# Patient Record
Sex: Male | Born: 1960 | Race: Black or African American | Hispanic: No | Marital: Single | Smoking: Former smoker
Health system: Southern US, Community
[De-identification: ages and names within clinical notes are randomized; demographics above are authoritative.]

## PROBLEM LIST (undated history)

## (undated) DIAGNOSIS — I10 Essential (primary) hypertension: Secondary | ICD-10-CM

## (undated) HISTORY — PX: BLADDER REPAIR: SHX76

## (undated) HISTORY — PX: CORONARY ANGIOPLASTY WITH STENT PLACEMENT: SHX49

## (undated) HISTORY — PX: OTHER SURGICAL HISTORY: SHX169

---

## 2020-07-22 ENCOUNTER — Emergency Department
Admission: EM | Admit: 2020-07-22 | Discharge: 2020-07-22 | Disposition: A | Discharge status: Home / Self Care | Payer: Medicaid Other | Attending: Emergency Medicine | Admitting: Emergency Medicine

## 2020-07-22 ENCOUNTER — Other Ambulatory Visit: Payer: Self-pay

## 2020-07-22 ENCOUNTER — Encounter: Payer: Self-pay | Admitting: *Deleted

## 2020-07-22 ENCOUNTER — Emergency Department: Payer: Medicaid Other

## 2020-07-22 DIAGNOSIS — R109 Unspecified abdominal pain: Secondary | ICD-10-CM | POA: Diagnosis not present

## 2020-07-22 DIAGNOSIS — I10 Essential (primary) hypertension: Secondary | ICD-10-CM | POA: Diagnosis not present

## 2020-07-22 DIAGNOSIS — Z955 Presence of coronary angioplasty implant and graft: Secondary | ICD-10-CM | POA: Diagnosis not present

## 2020-07-22 DIAGNOSIS — J690 Pneumonitis due to inhalation of food and vomit: Secondary | ICD-10-CM | POA: Diagnosis not present

## 2020-07-22 DIAGNOSIS — R112 Nausea with vomiting, unspecified: Secondary | ICD-10-CM | POA: Diagnosis present

## 2020-07-22 DIAGNOSIS — R52 Pain, unspecified: Secondary | ICD-10-CM

## 2020-07-22 DIAGNOSIS — M545 Low back pain, unspecified: Secondary | ICD-10-CM | POA: Diagnosis not present

## 2020-07-22 HISTORY — DX: Essential (primary) hypertension: I10

## 2020-07-22 LAB — CBC
HCT: 41.4 % (ref 39.0–52.0)
Hemoglobin: 14.2 g/dL (ref 13.0–17.0)
MCH: 30.7 pg (ref 26.0–34.0)
MCHC: 34.3 g/dL (ref 30.0–36.0)
MCV: 89.6 fL (ref 80.0–100.0)
Platelets: 193 10*3/uL (ref 150–400)
RBC: 4.62 MIL/uL (ref 4.22–5.81)
RDW: 12.6 % (ref 11.5–15.5)
WBC: 15.7 10*3/uL — ABNORMAL HIGH (ref 4.0–10.5)
nRBC: 0 % (ref 0.0–0.2)

## 2020-07-22 LAB — BASIC METABOLIC PANEL
Anion gap: 8 (ref 5–15)
BUN: 13 mg/dL (ref 6–20)
CO2: 24 mmol/L (ref 22–32)
Calcium: 8.6 mg/dL — ABNORMAL LOW (ref 8.9–10.3)
Chloride: 103 mmol/L (ref 98–111)
Creatinine, Ser: 0.94 mg/dL (ref 0.61–1.24)
GFR, Estimated: >60 mL/min (ref >60)
Glucose, Bld: 151 mg/dL — ABNORMAL HIGH (ref 70–99)
Potassium: 4.1 mmol/L (ref 3.5–5.1)
Sodium: 135 mmol/L (ref 135–145)

## 2020-07-22 LAB — HEPATIC FUNCTION PANEL
ALT: 28 U/L (ref 0–44)
AST: 28 U/L (ref 15–41)
Albumin: 4.2 g/dL (ref 3.5–5.0)
Alkaline Phosphatase: 66 U/L (ref 38–126)
Bilirubin, Direct: 0.4 mg/dL — ABNORMAL HIGH (ref 0.0–0.2)
Indirect Bilirubin: 3.8 mg/dL — ABNORMAL HIGH (ref 0.3–0.9)
Total Bilirubin: 4.2 mg/dL — ABNORMAL HIGH (ref 0.3–1.2)
Total Protein: 7.2 g/dL (ref 6.5–8.1)

## 2020-07-22 LAB — LACTIC ACID, PLASMA
Lactic Acid, Venous: 2.1 mmol/L (ref 0.5–1.9)
Lactic Acid, Venous: 1 mmol/L (ref 0.5–1.9)

## 2020-07-22 LAB — TROPONIN I (HIGH SENSITIVITY)
Troponin I (High Sensitivity): 6 ng/L (ref <18)
Troponin I (High Sensitivity): 7 ng/L (ref <18)

## 2020-07-22 LAB — LIPASE, BLOOD: Lipase: 22 U/L (ref 11–51)

## 2020-07-22 MED ORDER — SODIUM CHLORIDE 0.9 % IV SOLN
2.0000 g | Freq: Once | INTRAVENOUS | Status: AC
  Administered 2020-07-22: 2 g via INTRAVENOUS
  Filled 2020-07-22: qty 20

## 2020-07-22 MED ORDER — AMOXICILLIN-POT CLAVULANATE 875-125 MG PO TABS: 1.0000 | ORAL_TABLET | Freq: Two times a day (BID) | ORAL | 0 refills | Status: AC

## 2020-07-22 MED ORDER — PREDNISONE 20 MG PO TABS: 40.0000 mg | ORAL_TABLET | Freq: Every day | ORAL | 0 refills | Status: AC

## 2020-07-22 MED ORDER — DEXAMETHASONE SODIUM PHOSPHATE 10 MG/ML IJ SOLN
10.0000 mg | Freq: Once | INTRAMUSCULAR | Status: AC
  Administered 2020-07-22: 10 mg via INTRAVENOUS
  Filled 2020-07-22: qty 1

## 2020-07-22 MED ORDER — SODIUM CHLORIDE 0.9 % IV BOLUS
1000.0000 mL | Freq: Once | INTRAVENOUS | Status: AC
  Administered 2020-07-22: 1000 mL via INTRAVENOUS

## 2020-07-22 MED ORDER — ONDANSETRON HCL 4 MG/2ML IJ SOLN
4.0000 mg | Freq: Once | INTRAMUSCULAR | Status: AC
  Administered 2020-07-22: 4 mg via INTRAVENOUS
  Filled 2020-07-22: qty 2

## 2020-07-22 MED ORDER — ONDANSETRON 4 MG PO TBDP: 4.0000 mg | ORAL_TABLET | Freq: Three times a day (TID) | ORAL | 0 refills | Status: AC | PRN

## 2020-07-22 MED ORDER — OXYCODONE-ACETAMINOPHEN 5-325 MG PO TABS
2.0000 | ORAL_TABLET | Freq: Once | ORAL | Status: AC
  Administered 2020-07-22: 2 via ORAL
  Filled 2020-07-22: qty 2

## 2020-07-22 NOTE — ED Triage Notes (Signed)
Pt had one episode bile emesis after triage.

## 2020-07-22 NOTE — ED Provider Notes (Signed)
[facility] Emergency Department Provider Note  ____________________________________________   Event Date/Time   First MD Initiated Contact with Patient 07/22/20 1927     (approximate)  I have reviewed the triage vital signs and the nursing notes.   HISTORY  Chief Complaint Emesis and Chest Pain    HPI Ethan Arellano is a 60 y.o. male here with multiple complaints.  Patient's primary complaint is diffuse, mild abdominal pain with associated nausea and vomiting.  He went to a cookout on July 4, then woke up with diffuse abdominal pain.  He said ongoing, aching, gnawing, cramp-like abdominal pain since then.  He is also noticed increasing lower back pain, with radiation of numbness and a cramping sensation down the back of his right leg.  Has history of lower back issues, and this pain was there prior to the vomiting, but has now been made worse.  Denies any falls.  No lower extremity weakness or numbness.  No loss of bowel or bladder function.  No fevers or chills.      Past Medical History:  Diagnosis Date   Hypertension     There are no problems to display for this patient.   Past Surgical History:  Procedure Laterality Date   BLADDER REPAIR     CORONARY ANGIOPLASTY WITH STENT PLACEMENT      Prior to Admission medications   Medication Sig Start Date End Date Taking? Authorizing Provider  amoxicillin-clavulanate (AUGMENTIN) 875-125 MG tablet Take 1 tablet by mouth 2 (two) times daily for 7 days. 07/22/20 07/29/20 Yes Shaune PollackIsaacs, Laira Penninger, MD  ondansetron (ZOFRAN ODT) 4 MG disintegrating tablet Take 1 tablet (4 mg total) by mouth every 8 (eight) hours as needed for nausea or vomiting. 07/22/20  Yes Shaune PollackIsaacs, Florice Hindle, MD  predniSONE (DELTASONE) 20 MG tablet Take 2 tablets (40 mg total) by mouth daily for 5 days. 07/22/20 07/27/20 Yes Shaune PollackIsaacs, Gedalia Mcmillon, MD    Allergies Patient has no known allergies.  No family history on file.  Social History    Review of  Systems  Review of Systems  Constitutional:  Positive for fatigue. Negative for chills and fever.  HENT:  Negative for sore throat.   Respiratory:  Negative for shortness of breath.   Cardiovascular:  Negative for chest pain.  Gastrointestinal:  Positive for abdominal pain, nausea and vomiting.  Genitourinary:  Negative for flank pain.  Musculoskeletal:  Positive for back pain and myalgias. Negative for neck pain.  Skin:  Negative for rash and wound.  Allergic/Immunologic: Negative for immunocompromised state.  Neurological:  Negative for weakness and numbness.  Hematological:  Does not bruise/bleed easily.  All other systems reviewed and are negative.   ____________________________________________  PHYSICAL EXAM:      VITAL SIGNS: ED Triage Vitals  Enc Vitals Group     BP 07/22/20 1907 (!) 82/59     Pulse Rate 07/22/20 1907 (!) 42     Resp 07/22/20 1907 20     Temp 07/22/20 1907 98.5 F (36.9 C)     Temp Source 07/22/20 1907 Oral     SpO2 07/22/20 1907 99 %     Weight 07/22/20 1914 230 lb (104.3 kg)     Height 07/22/20 1914 5\' 8"  (1.727 m)     Head Circumference --      Peak Flow --      Pain Score 07/22/20 1914 10     Pain Loc --      Pain Edu? --  Excl. in GC? --      Physical Exam Vitals and nursing note reviewed.  Constitutional:      General: He is not in acute distress.    Appearance: He is well-developed.  HENT:     Head: Normocephalic and atraumatic.  Eyes:     Conjunctiva/sclera: Conjunctivae normal.  Cardiovascular:     Rate and Rhythm: Normal rate and regular rhythm.     Heart sounds: Normal heart sounds. No murmur heard.   No friction rub.  Pulmonary:     Effort: Pulmonary effort is normal. No respiratory distress.     Breath sounds: Normal breath sounds. No wheezing or rales.  Abdominal:     General: There is no distension.     Palpations: Abdomen is soft.     Tenderness: There is abdominal tenderness (Mild, diffuse, without rebound or  guarding).  Musculoskeletal:     Cervical back: Neck supple.     Comments: Moderate paraspinal lower lumbar tenderness.  Tenderness over the right sciatic area, with radiation down the posterior right leg to the knee.  Strength out of 5 bilateral lower extremities.  Normal sensation to light touch.  Skin:    General: Skin is warm.     Capillary Refill: Capillary refill takes less than 2 seconds.  Neurological:     Mental Status: He is alert and oriented to person, place, and time.     Motor: No abnormal muscle tone.      ____________________________________________   LABS (all labs ordered are listed, but only abnormal results are displayed)  Labs Reviewed  BASIC METABOLIC PANEL - Abnormal; Notable for the following components:      Result Value   Glucose, Bld 151 (*)    Calcium 8.6 (*)    All other components within normal limits  CBC - Abnormal; Notable for the following components:   WBC 15.7 (*)    All other components within normal limits  HEPATIC FUNCTION PANEL - Abnormal; Notable for the following components:   Total Bilirubin 4.2 (*)    Bilirubin, Direct 0.4 (*)    Indirect Bilirubin 3.8 (*)    All other components within normal limits  LACTIC ACID, PLASMA - Abnormal; Notable for the following components:   Lactic Acid, Venous 2.1 (*)    All other components within normal limits  LIPASE, BLOOD  LACTIC ACID, PLASMA  TROPONIN I (HIGH SENSITIVITY)  TROPONIN I (HIGH SENSITIVITY)    ____________________________________________  EKG: Sinus bradycardia, ventricular rate 59.  PR 244, QRS 80, QTc 4 5.  No acute ST elevations or depressions.  No EKG evidence of acute ischemia or infarct. ________________________________________  RADIOLOGY All imaging, including plain films, CT scans, and ultrasounds, independently reviewed by me, and interpretations confirmed via formal radiology reads.  ED MD interpretation:   Chest x-ray: Patchy airspace infiltrate in the left lower  lung CT abdomen/pelvis: Left lower lobe pneumonia, otherwise no acute intra-abdominal pathology CT lumbar spine: Mild degenerative changes with multilevel disc bulges  Official radiology report(s): CT ABDOMEN PELVIS WO CONTRAST  Result Date: 07/22/2020 CLINICAL DATA:  Vomiting, abdominal pain, and right leg pain. EXAM: CT ABDOMEN AND PELVIS WITHOUT CONTRAST TECHNIQUE: Multidetector CT imaging of the abdomen and pelvis was performed following the standard protocol without IV contrast. COMPARISON:  None. FINDINGS: Lower chest: Focal airspace infiltration in the left lung base, likely pneumonia. Small esophageal hiatal hernia behind the heart. Hepatobiliary: No focal liver abnormality is seen. No gallstones, gallbladder wall thickening, or biliary dilatation.  Pancreas: Unremarkable. No pancreatic ductal dilatation or surrounding inflammatory changes. Spleen: Normal in size without focal abnormality. Adrenals/Urinary Tract: Adrenal glands are unremarkable. Kidneys are normal, without renal calculi, focal lesion, or hydronephrosis. Bladder is unremarkable. Stomach/Bowel: Stomach, small bowel, and colon are not abnormally distended. No wall thickening or inflammatory changes are appreciated. Appendix is normal. Vascular/Lymphatic: Aortic atherosclerosis. No enlarged abdominal or pelvic lymph nodes. Reproductive: Prostate is unremarkable. Other: No abdominal wall hernia or abnormality. No abdominopelvic ascites. Musculoskeletal: No acute or significant osseous findings. Postoperative changes in the left proximal femur. IMPRESSION: 1. Focal airspace infiltration in the left lung base, likely pneumonia. 2. Small esophageal hiatal hernia. 3. No acute process demonstrated in the abdomen or pelvis. 4. Aortic atherosclerosis. Electronically Signed   By: Burman Nieves M.D.   On: 07/22/2020 20:26   DG Chest 2 View  Result Date: 07/22/2020 CLINICAL DATA:  Shortness of breath, vomiting, and right-sided chest pain.  Diarrhea. EXAM: CHEST - 2 VIEW COMPARISON:  None. FINDINGS: Heart size and pulmonary vascularity are normal. Suggestion of patchy airspace infiltration in the left base, possibly early pneumonia. No pleural effusions. No pneumothorax. Mediastinal contours appear intact. IMPRESSION: Suggestion of patchy airspace infiltrates in the left lower lung. Electronically Signed   By: Burman Nieves M.D.   On: 07/22/2020 19:41   CT L-SPINE NO CHARGE  Result Date: 07/22/2020 CLINICAL DATA:  Vomiting, abdominal pain, and right leg pain. EXAM: CT LUMBAR SPINE WITHOUT CONTRAST TECHNIQUE: Multidetector CT imaging of the lumbar spine was performed without intravenous contrast administration. Multiplanar CT image reconstructions were also generated. COMPARISON:  None. FINDINGS: Segmentation: 5 lumbar type vertebrae. Alignment: Normal. Vertebrae: Schmorl's nodes at the inferior inflate of L3 and L5. No vertebral compression deformities. No focal bone lesion or bone destruction. Paraspinal and other soft tissues: No abnormal paraspinal soft tissue mass or infiltration. Calcification of the aorta without aneurysm. Disc levels: Mild degenerative changes with small endplate osteophyte formation. Disc space heights are mostly normal. Bulging disc annuli are suggested at L3-4, L4-5, and L5-S1 causing some apparent effacement of the anterior thecal sac. Probable effacement of the right lateral recess at L5-S1. IMPRESSION: 1. No acute displaced fractures identified. 2. Mild degenerative changes with multilevel disc bulges in the lower lumbar spine. Electronically Signed   By: Burman Nieves M.D.   On: 07/22/2020 20:28    ____________________________________________  PROCEDURES   Procedure(s) performed (including Critical Care):  Procedures  ____________________________________________  INITIAL IMPRESSION / MDM / ASSESSMENT AND PLAN / ED COURSE  As part of my medical decision making, I reviewed the following data within  the electronic MEDICAL RECORD NUMBER Nursing notes reviewed and incorporated, Old chart reviewed, Notes from prior ED visits, and [place] Controlled Substance Database       *Green Quincy was evaluated in Emergency Department on 07/22/2020 for the symptoms described in the history of present illness. He was evaluated in the context of the global COVID-19 pandemic, which necessitated consideration that the patient might be at risk for infection with the SARS-CoV-2 virus that causes COVID-19. Institutional protocols and algorithms that pertain to the evaluation of patients at risk for COVID-19 are in a state of rapid change based on information released by regulatory bodies including the CDC and federal and state organizations. These policies and algorithms were followed during the patient's care in the ED.  Some ED evaluations and interventions may be delayed as a result of limited staffing during the pandemic.*     Medical Decision Making: 60 year old male  here with multiple complaints, primarily nausea, vomiting, and some mild cough or shortness of breath.  Regarding his nausea and vomiting, unclear primary etiology.  His abdomen is soft with no focal tenderness.  CT scan obtained, shows left lower lobe pneumonia, but otherwise no intra-abdominal pathology.  Lab work shows mild leukocytosis and dehydration on arrival, with very minimal lactic acid elevation which cleared with fluids.  LFTs normal.  Patient has slight bilirubin elevation but no evidence of abnormal gallbladder or acute inflammation in the right upper quadrant on CT.  Otherwise, he is not hypoxic and is otherwise very well-appearing.  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  Symptoms are consistent with likely sciatica versus inflamed lumbar radiculopathy.  CT of the lumbar spine shows degenerative changes multilevel disc bulges consistent with this.  No evidence of cauda  equina.  No risk factors for epidural abscess or psoas abscess.  Patient feels markedly improved after fluids and present to control.  Lactic acid is completely cleared.  Will treat for community-acquired versus aspiration pneumonia, treat symptomatically, discharged home.  ____________________________________________  FINAL CLINICAL IMPRESSION(S) / ED DIAGNOSES  Final diagnoses:  Aspiration pneumonia of left lower lobe, unspecified aspiration pneumonia type [facility])     MEDICATIONS GIVEN DURING THIS VISIT:  Medications  sodium chloride 0.9 % bolus 1,000 mL (0 mLs Intravenous Stopped 07/22/20 2254)  ondansetron (ZOFRAN) injection 4 mg (4 mg Intravenous Given 07/22/20 2140)  cefTRIAXone (ROCEPHIN) 2 g in sodium chloride 0.9 % 100 mL IVPB (0 g Intravenous Stopped 07/22/20 2221)  dexamethasone (DECADRON) injection 10 mg (10 mg Intravenous Given 07/22/20 2140)  oxyCODONE-acetaminophen (PERCOCET/ROXICET) 5-325 MG per tablet 2 tablet (2 tablets Oral Given 07/22/20 2140)     ED Discharge Orders          Ordered    amoxicillin-clavulanate (AUGMENTIN) 875-125 MG tablet  2 times daily        07/22/20 2252    ondansetron (ZOFRAN ODT) 4 MG disintegrating tablet  Every 8 hours PRN        07/22/20 2252    predniSONE (DELTASONE) 20 MG tablet  Daily        07/22/20 2252             Note:  This document was prepared using Dragon voice recognition software and may include unintentional dictation errors.   Shaune Pollack, MD 07/22/20 [phone]

## 2020-07-22 NOTE — ED Triage Notes (Signed)
Pt c/o right sided chest pain, shortness of breath, and vomiting. Has also had some diarrhea. Diaphoretic.

## 2020-07-22 NOTE — ED Notes (Signed)
Pt reports vomiting x 1 with abd pain and right leg pain.  No diarrhea.  Pt diaphoretic in triage and brought to room 9 stat.  Iv started and labs sent.  Pt alert, speech clear.

## 2020-08-04 ENCOUNTER — Ambulatory Visit: Payer: Medicaid Other | Admitting: Physical Therapy

## 2020-08-07 ENCOUNTER — Ambulatory Visit: Payer: Medicaid Other | Attending: Physical Medicine & Rehabilitation | Admitting: Physical Therapy

## 2020-08-07 ENCOUNTER — Encounter: Payer: Self-pay | Admitting: Physical Therapy

## 2020-08-07 DIAGNOSIS — M6281 Muscle weakness (generalized): Secondary | ICD-10-CM

## 2020-08-07 DIAGNOSIS — R262 Difficulty in walking, not elsewhere classified: Secondary | ICD-10-CM

## 2020-08-07 DIAGNOSIS — M5441 Lumbago with sciatica, right side: Secondary | ICD-10-CM

## 2020-08-07 NOTE — Therapy (Deleted)
[facility] [address], [place], [address] Phone: [phone]   Fax:  [phone]  Physical Therapy Evaluation  Patient Details  Name: Ethan Arellano MRN: [id number] Date of Birth: 1960/09/23 No data recorded  Encounter Date: 08/07/2020    Past Medical History:  Diagnosis Date   Hypertension     Past Surgical History:  Procedure Laterality Date   BLADDER REPAIR     CORONARY ANGIOPLASTY WITH STENT PLACEMENT      There were no vitals filed for this visit.            SUBJECTIVE: HISTORY:  Patient is a 60 y.o. male who presents to outpatient physical therapy with a referral for medical diagnosis right lumbar radiculitis. This patient's chief complaints consist of ***, leading to the following functional deficits: ***. Relevant past medical history and comorbidities include HTN, coronary angioplasty with stent placement, hx bladder repair.  Patient denies hx of cancer, stroke, seizures, lung problem, major cardiac events, diabetes, unexplained weight loss, changes in bowel or bladder problems, new onset stumbling or dropping things apart from described below.   Patient states condition started when ***.   Functional Limitations: ***         Objective measurements completed on examination: See above findings.                             Patient will benefit from skilled therapeutic intervention in order to improve the following deficits and impairments:     Visit Diagnosis: No diagnosis found.     Problem List There are no problems to display for this patient.   Cira Rue 08/07/2020, 10:55 AM  [facility] [address], [place], [address] Phone: [phone]   Fax:  [phone]  Name: Ethan Arellano MRN: [id number] Date of Birth: 09-11-1960
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
## 2020-08-07 NOTE — Patient Instructions (Signed)
  HOME EXERCISE PROGRAM Access Code: [email or web address] URL: [email or web address] Date: 08/07/2020 Prepared by: Norton Blizzard  Exercises Seated Slump Nerve Glide - 3 x daily - 15 reps

## 2020-08-12 ENCOUNTER — Ambulatory Visit: Payer: Medicaid Other | Admitting: Physical Therapy

## 2020-08-12 ENCOUNTER — Encounter: Payer: Self-pay | Admitting: Physical Therapy

## 2020-08-12 DIAGNOSIS — M5441 Lumbago with sciatica, right side: Secondary | ICD-10-CM | POA: Diagnosis not present

## 2020-08-12 DIAGNOSIS — R262 Difficulty in walking, not elsewhere classified: Secondary | ICD-10-CM

## 2020-08-12 DIAGNOSIS — M6281 Muscle weakness (generalized): Secondary | ICD-10-CM

## 2020-08-12 NOTE — Therapy (Signed)
[facility] [address] [place], [address] Phone: [phone]   Fax:  [phone]  Physical Therapy Treatment  Patient Details  Name: Ethan Arellano MRN: [id number] Date of Birth: 03/27/1960 Referring Provider (PT): Carnella Guadalajara, DO   Encounter Date: 08/12/2020   PT End of Session - 08/12/20 1151     Visit Number 2    Number of Visits 24    Date for PT Re-Evaluation 10/30/20    Authorization Type [place] [employer]ICA[employer] OF  reporting period from 08/07/2020    Authorization Time Period Max 27 combined PT/OT/SLP per year  Berkley Harvey is not required for first 12 visits, then auth requried    Authorization - Visit Number 2    Authorization - Number of Visits 12    Progress Note Due on Visit 10    PT Start Time 1130    PT Stop Time 1210    PT Time Calculation (min) 40 min    Activity Tolerance Patient tolerated treatment well    Behavior During Therapy --   required frequent redirection            Past Medical History:  Diagnosis Date   Hypertension     Past Surgical History:  Procedure Laterality Date   BLADDER REPAIR     CORONARY ANGIOPLASTY WITH STENT PLACEMENT      There were no vitals filed for this visit.   Subjective Assessment - 08/12/20 1132     Subjective Patient reports his right leg is still feeling numb. States it is seems to be improving over time and feels that PT from last session helped some. He keeps getting a cramp in his right calf and is worried that he has a blood clot there. Does not provide a numeric pain rating.    Pertinent History Patient is a 60 y.o. male who presents to outpatient physical therapy with a referral for medical diagnosis right lumbar radiculitis with plan to start him in a mechanical diagnosis and treatment approach. This patient's chief complaints consist of constant bilateral low back pain with right radicular symptoms to the toe since 07/21/2020  (with extended history of episodic localized left back pain) leading to the following functional deficits: difficulty with all activiites due to constant pain including, bed mobility, laying down, sleeping, sitting, walking, dressing, stairs, daily activities.   Relevant past medical history and comorbidities include former smoker, HTN, CAD, DM, PAD, pneumonia,  FRACTURE SURGERY left  femur, left lower leg, MANDIBLE SURGERY 1995, hx bladder repair, hx coronary angioplasty with stent placement.  Patient denies hx of cancer, stroke, lung problem, unexplained weight loss, changes in bowel or bladder problems, spine surgery.    Limitations Sitting;Lifting;Standing;Walking;House hold activities    Diagnostic tests CT report 07/22/2020: "FINDINGS:  Segmentation: 5 lumbar type vertebrae.     Alignment: Normal.     Vertebrae: Schmorl's nodes at the inferior inflate of L3 and L5. No  vertebral compression deformities. No focal bone lesion or bone  destruction.     Paraspinal and other soft tissues: No abnormal paraspinal soft  tissue mass or infiltration. Calcification of the aorta without  aneurysm.     Disc levels: Mild degenerative changes with small endplate  osteophyte formation. Disc space heights are mostly normal. Bulging  disc annuli are suggested at L3-4, L4-5, and L5-S1 causing some  apparent effacement of the anterior thecal sac. Probable effacement  of the right lateral recess at L5-S1.  IMPRESSION:  1. No acute displaced fractures identified.  2. Mild degenerative changes with multilevel disc bulges in the  lower lumbar spine."    Currently in Pain? Yes    Pain Onset More than a month ago             TREATMENT:    Therapeutic exercise: to centralize symptoms and improve ROM, strength, muscular endurance, and activity tolerance required for successful completion of functional activities.  - seated sciatic nerve glide in slump position, R LE, 1x15 (Manual therapy - see below) - prone press up  3x10 (pain centralizing from calf to upper thigh, with lock and sag last rep) - hooklying low trunk rotation, 1x20 each direction - quadruped bird dog 1x4 each side plus practice (difficulty due to R calf cramp) - supine R hamstring stretch, 3x30 seconds - hooklying bridge with hip abduction against blue band around knees, 5 second holds, 2x10 - Education on HEP including handout   Manual therapy: to reduce pain and tissue tension, improve range of motion, neuromodulation, in order to promote improved ability to complete functional activities. - prone CPA grade III-IV to lower thoracic and lumbar segments - prone STM to right glute, posterior thigh, and calf muscles (decreased pain in calf).     HOME EXERCISE PROGRAM Access Code: [email or web address] URL: [email or web address] Date: 08/12/2020 Prepared by: Norton Blizzard  Exercises Seated Slump Nerve Glide - 3 x daily - 15 reps Prone Press Up - 3 x daily - 10-15 reps - 1 second hold Hamstring stretch (with strap) - 1-2 x daily - 3 reps - 30 seconds hold Bridge with Hip Abduction and Resistance - Ground Touches - 1-2 x daily - 2 sets - 10 reps - 5 seconds hold   PT Education - 08/12/20 1215     Education Details Exercise purpose/form. Self management techniques    Person(s) Educated Patient    Methods Explanation;Demonstration;Tactile cues;Verbal cues;Handout    Comprehension Verbalized understanding;Returned demonstration;Verbal cues required;Tactile cues required;Need further instruction              PT Short Term Goals - 08/07/20 1640       PT SHORT TERM GOAL #1   Title Be independent with initial home exercise program for self-management of symptoms.    Baseline Initial HEP provided at IE (08/07/2020);    Time 2    Period Weeks    Status New    Target Date 08/21/20               PT Long Term Goals - 08/07/20 1641       PT LONG TERM GOAL #1   Title Be independent with a long-term home exercise program for  self-management of symptoms.    Baseline Initial HEP provided at IE (08/07/2020);    Time 12    Period Weeks    Status New   TARGET DATE FOR ALL LONG TERM GOALS: 10/30/2020     PT LONG TERM GOAL #2   Title Demonstrate improved FOTO score to equal or greater than 47 by visit #11 to demonstrate improvement in overall condition and self-reported functional ability.    Baseline 16 (08/07/2020);    Time 12    Period Weeks    Status New      PT LONG TERM GOAL #3   Title Have full lumbar AROM with no compensations or increase in pain in all planes except intermittent end range discomfort to allow patient to complete valued activities with less difficulty.  Baseline painful and limited - see objective exam (08/07/2020);    Time 12    Period Weeks    Status New      PT LONG TERM GOAL #4   Title Improve B LE strength to 4+/5 with no increase in pain to improve patient's ability to complete  valued functional tasks such as walking, using stairs, transfers with less difficulty.    Baseline painful and weak - see objective exam (08/07/2020);    Time 12    Period Weeks    Status New      PT LONG TERM GOAL #5   Title Complete community, work and/or recreational activities without limitation due to current condition    Baseline ifficulty with all activiites due to constant pain including, bed mobility, laying down, sleeping, sitting, walking, dressing, stairs, daily activities (08/07/2020);    Time 12    Period Weeks    Status New      Additional Long Term Goals   Additional Long Term Goals Yes      PT LONG TERM GOAL #6   Title Reduce pain with functional activities to equal or less than 3/10 to allow patient to complete usual activities including ADLs, IADLs, sleeping, and social engagement with less difficulty.    Baseline reported as 10/10 constantly (08/07/2020);    Time 12    Period Weeks    Status New                   Plan - 08/12/20 1218     Clinical Impression Statement  Patient tolerated treatment well with no increase in pain overall. Does appear to be improving with subjective report of improvement and observed to be moving more freely with sit <> stand and mobility today in clinic. Did report some centralizing from right calf to proximal thigh during prone press up with no lasting improvement. No abnormal redness, heat, swelling or exquisite tenderness noted at right calf and patient educated that he is most likely experiencing cramps there due to spinal nerve irritation and there are no signs of blood clot, which relieved some of his anxiety. Did have some cramping there with visible muscle contraction when performing bird dog exercise so discontinued for now. Updated HEP as appropriate. Patient would benefit from continued management of limiting condition by skilled physical therapist to address remaining impairments and functional limitations to work towards stated goals and return to PLOF or maximal functional independence.    Personal Factors and Comorbidities Age;Behavior Pattern;Comorbidity 3+;Social Background;Past/Current Experience;Fitness;Time since onset of injury/illness/exacerbation    Comorbidities Relevant past medical history and comorbidities include former smoker, HTN, CAD, DM, PAD, pneumonia,  FRACT    Examination-Activity Limitations Bathing;Squat;Lift;Stairs;Bed Mobility;Bend;Locomotion Level;Stand;Carry;Transfers;Sit;Dressing;Sleep    Examination-Participation Restrictions Community Activity;Yard Work;Interpersonal Relationship    Stability/Clinical Decision Making Evolving/Moderate complexity    Rehab Potential Good    PT Frequency 2x / week    PT Duration 12 weeks    PT Treatment/Interventions ADLs/Self Care Home Management;Aquatic Therapy;Cryotherapy;Moist Heat;Traction;Electrical Stimulation;Gait training;Stair training;Functional mobility training;Therapeutic activities;Therapeutic exercise;Balance training;Neuromuscular re-education;Manual  techniques;Dry needling;Passive range of motion;Patient/family education;Spinal Manipulations;Joint Manipulations    PT Next Visit Plan continue to assess for directional preference as appropriate, strength/mobility as tolerated, neurodynamics, update HEP as appropriate    PT Home Exercise Plan Medbridge Access Code: [email or web address]    Consulted and Agree with Plan of Care Patient             Patient will benefit from skilled therapeutic intervention in order to improve  the following deficits and impairments:  Abnormal gait, Impaired sensation, Improper body mechanics, Pain, Decreased mobility, Increased muscle spasms, Postural dysfunction, Decreased activity tolerance, Decreased endurance, Decreased range of motion, Decreased strength, Hypomobility, Impaired perceived functional ability, Difficulty walking, Decreased balance  Visit Diagnosis: Bilateral low back pain with right-sided sciatica, unspecified chronicity  Difficulty in walking, not elsewhere classified  Muscle weakness (generalized)     Problem List There are no problems to display for this patient.   Luretha Murphy. Ilsa Iha, PT, DPT 08/12/20, 12:21 PM   [facility] SPORTS [facility]ICINE [address], [place], [address] Phone: [phone]   Fax:  [phone]  Name: Ethan Arellano MRN: [id number] Date of Birth: Mar 12, 1960

## 2020-08-14 ENCOUNTER — Encounter: Payer: Self-pay | Admitting: Physical Therapy

## 2020-08-14 ENCOUNTER — Ambulatory Visit: Payer: Medicaid Other | Admitting: Physical Therapy

## 2020-08-14 DIAGNOSIS — R262 Difficulty in walking, not elsewhere classified: Secondary | ICD-10-CM

## 2020-08-14 DIAGNOSIS — M5441 Lumbago with sciatica, right side: Secondary | ICD-10-CM | POA: Diagnosis not present

## 2020-08-14 DIAGNOSIS — M6281 Muscle weakness (generalized): Secondary | ICD-10-CM

## 2020-08-14 NOTE — Therapy (Addendum)
[facility] [address] [place], [address] Phone: [phone]   Fax:  [phone]  Physical Therapy Treatment  Patient Details  Name: Ethan Arellano MRN: [id number] Date of Birth: [birth year] Referring Provider (PT): Ethan Guadalajara, DO   Encounter Date: 08/14/2020   PT End of Session - 08/14/20 1150     Visit Number 3    Number of Visits 24    Date for PT Re-Evaluation 10/30/20    Authorization Type [place] MEDICAID [employer] CARITAS OF [place] reporting period from 08/07/2020    Authorization Time Period Max 27 combined PT/OT/SLP per year  Berkley Harvey is not required for first 12 visits, then [place] requried    Authorization - Visit Number 2    Authorization - Number of Visits 12    Progress Note Due on Visit 10    PT Start Time 1035    PT Stop Time 1135    PT Time Calculation (min) 60 min    Activity Tolerance Patient tolerated treatment well    Behavior During Therapy --             Past Medical History:  Diagnosis Date   Hypertension     Past Surgical History:  Procedure Laterality Date   BLADDER REPAIR     CORONARY ANGIOPLASTY WITH STENT PLACEMENT      There were no vitals filed for this visit.   08/14/20 1143  Symptoms/Limitations  Subjective Patient reports "doing okay." He states that he tried to do all the exercises we gave  last time. Patient believes that he pulled a muscle in his R inner thigh. He asked his primary care doctor to take imaging to determine if he strained it. He states that he is constantly in pain. Current pain reported 8/10. Patient states that he is getting more used to the numbness in his leg.  Pertinent History Patient is a 60 y.o. male who presents to outpatient physical therapy with a referral for medical diagnosis right lumbar radiculitis with plan to start him in a mechanical diagnosis and treatment approach. This patient's chief complaints consist of constant bilateral low  back pain with right radicular symptoms to the toe since 07/21/2020 (with extended history of episodic localized left back pain) leading to the following functional deficits: difficulty with all activiites due to constant pain including, bed mobility, laying down, sleeping, sitting, walking, dressing, stairs, daily activities.   Relevant past medical history and comorbidities include former smoker, HTN, CAD, DM, PAD, pneumonia,  FRACTURE SURGERY left  femur, left lower leg, MANDIBLE SURGERY 1995, hx bladder repair, hx coronary angioplasty with stent placement.  Patient denies hx of cancer, stroke, lung problem, unexplained weight loss, changes in bowel or bladder problems, spine surgery.  Limitations Sitting;Lifting;Standing;Walking;House hold activities  Diagnostic tests CT report 07/22/2020: "FINDINGS:  Segmentation: 5 lumbar type vertebrae.     Alignment: Normal.     Vertebrae: Schmorl's nodes at the inferior inflate of L3 and L5. No  vertebral compression deformities. No focal bone lesion or bone  destruction.     Paraspinal and other soft tissues: No abnormal paraspinal soft  tissue mass or infiltration. Calcification of the aorta without  aneurysm.     Disc levels: Mild degenerative changes with small endplate  osteophyte formation. Disc space heights are mostly normal. Bulging  disc annuli are suggested at L3-4, L4-5, and L5-S1 causing some  apparent effacement of the anterior thecal sac. Probable effacement  of the right lateral  recess at L5-S1.     IMPRESSION:  1. No acute displaced fractures identified.  2. Mild degenerative changes with multilevel disc bulges in the  lower lumbar spine."  Pain Assessment  Pain Score 8  Pain Location Back  Pain Orientation Right;Left  Pain Onset More than a month ago  Pain Frequency Constant    TREATMENT: Manual therapy: to reduce pain and tissue tension, improve range of motion, neuromodulation, in order to promote improved ability to complete functional  activities. - prone CPA grade III-IV to lower thoracic and lumbar segments - prone STM to right glute, posterior thigh, and calf muscles    Therapeutic exercise: to centralize symptoms and improve ROM, strength, muscular endurance, and activity tolerance required for successful completion of functional activities.  - prone press up 3x10 - manual therapy (see above). - prone press up 2x10 - quadruped bird dog 2x8 each side plus practice - hooklying bridge with hip abduction against blue band around knees, 5 second holds, 2x10 - seated sciatic nerve glide in slump position, R LE, 1x15  Pt required multimodal cuing for proper technique and to facilitate improved neuromuscular control, strength, range of motion, and functional ability resulting in improved performance and form.   HOME EXERCISE PROGRAM Access Code: [email or web address] URL: [email or web address] Date: 08/12/2020 Prepared by: Norton BlizzardSara Snyder   Exercises Seated Slump Nerve Glide - 3 x daily - 15 reps Prone Press Up - 3 x daily - 10-15 reps - 1 second hold Hamstring stretch (with strap) - 1-2 x daily - 3 reps - 30 seconds hold Bridge with Hip Abduction and Resistance - Ground Touches - 1-2 x daily - 2 sets - 10 reps - 5 seconds hold     PT Education - 08/14/20 1149     Education Details Exercise purpose/form. Self management techniques    Person(s) Educated Patient    Methods Explanation;Demonstration;Tactile cues;Verbal cues    Comprehension Verbalized understanding;Returned demonstration;Tactile cues required;Verbal cues required              PT Short Term Goals - 08/07/20 1640       PT SHORT TERM GOAL #1   Title Be independent with initial home exercise program for self-management of symptoms.    Baseline Initial HEP provided at IE (08/07/2020);    Time 2    Period Weeks    Status New    Target Date 08/21/20               PT Long Term Goals - 08/07/20 1641       PT LONG TERM GOAL #1   Title Be  independent with a long-term home exercise program for self-management of symptoms.    Baseline Initial HEP provided at IE (08/07/2020);    Time 12    Period Weeks    Status New   TARGET DATE FOR ALL LONG TERM GOALS: 10/30/2020     PT LONG TERM GOAL #2   Title Demonstrate improved FOTO score to equal or greater than 47 by visit #11 to demonstrate improvement in overall condition and self-reported functional ability.    Baseline 16 (08/07/2020);    Time 12    Period Weeks    Status New      PT LONG TERM GOAL #3   Title Have full lumbar AROM with no compensations or increase in pain in all planes except intermittent end range discomfort to allow patient to complete valued activities with less difficulty.    Baseline painful and limited -  see objective exam (08/07/2020);    Time 12    Period Weeks    Status New      PT LONG TERM GOAL #4   Title Improve B LE strength to 4+/5 with no increase in pain to improve patient's ability to complete  valued functional tasks such as walking, using stairs, transfers with less difficulty.    Baseline painful and weak - see objective exam (08/07/2020);    Time 12    Period Weeks    Status New      PT LONG TERM GOAL #5   Title Complete community, work and/or recreational activities without limitation due to current condition    Baseline ifficulty with all activiites due to constant pain including, bed mobility, laying down, sleeping, sitting, walking, dressing, stairs, daily activities (08/07/2020);    Time 12    Period Weeks    Status New      Additional Long Term Goals   Additional Long Term Goals Yes      PT LONG TERM GOAL #6   Title Reduce pain with functional activities to equal or less than 3/10 to allow patient to complete usual activities including ADLs, IADLs, sleeping, and social engagement with less difficulty.    Baseline reported as 10/10 constantly (08/07/2020);    Time 12    Period Weeks    Status New                    Plan - 08/14/20 1153     Clinical Impression Statement Patient tolerated treatment well with no increase in pain overall. Patient mobility appears to continue improving with bed mobility, sit to stands, and during transitions between exerices on and off the plinth table. Patient resonds well to manual therapy per patient report in which he states manual therapy decreases patient symptoms of pain and tightness. Patient demonstrated increased control while performing bird dogs. However, patient quickly fatigues and has difficulty shifting weight while performing bird dog exercises. Past surgical history and general LE weakness likely contributes to difficulty. Patient will benefit from skilled physical therapy to address remaining impairments and functional limitations to work towards stated goals and return to PLOF or maximal functional independence.    Personal Factors and Comorbidities Age;Behavior Pattern;Comorbidity 3+;Social Background;Past/Current Experience;Fitness;Time since onset of injury/illness/exacerbation    Comorbidities Relevant past medical history and comorbidities include former smoker, HTN, CAD, DM, PAD, pneumonia,  FRACT    Examination-Activity Limitations Bathing;Squat;Lift;Stairs;Bed Mobility;Bend;Locomotion Level;Stand;Carry;Transfers;Sit;Dressing;Sleep    Examination-Participation Restrictions Community Activity;Yard Work;Interpersonal Relationship    Stability/Clinical Decision Making Evolving/Moderate complexity    Rehab Potential Good    PT Frequency 2x / week    PT Duration 12 weeks    PT Treatment/Interventions ADLs/Self Care Home Management;Aquatic Therapy;Cryotherapy;Moist Heat;Traction;Electrical Stimulation;Gait training;Stair training;Functional mobility training;Therapeutic activities;Therapeutic exercise;Balance training;Neuromuscular re-education;Manual techniques;Dry needling;Passive range of motion;Patient/family education;Spinal Manipulations;Joint Manipulations     PT Next Visit Plan continue to assess for directional preference as appropriate, strength/mobility as tolerated, neurodynamics, update HEP as appropriate    PT Home Exercise Plan Medbridge Access Code: [email or web address]    Consulted and Agree with Plan of Care Patient             Patient will benefit from skilled therapeutic intervention in order to improve the following deficits and impairments:  Abnormal gait, Impaired sensation, Improper body mechanics, Pain, Decreased mobility, Increased muscle spasms, Postural dysfunction, Decreased activity tolerance, Decreased endurance, Decreased range of motion, Decreased strength, Hypomobility, Impaired perceived functional ability, Difficulty walking, Decreased balance  Visit Diagnosis: Bilateral low back pain with right-sided sciatica, unspecified chronicity  Difficulty in walking, not elsewhere classified  Muscle weakness (generalized)     Problem List There are no problems to display for this patient.  Vanessa Barbara, SPT  Student physical therapist under direct supervision of licensed physical therapists during the entirety of the session.   Luretha Murphy. Ilsa Iha, PT, DPT 08/14/20, 4:27 PM   Addendum to restore missing subjective information on 11/17/2021.   Luretha Murphy. Ilsa Iha, PT, DPT 11/17/21, 4:31 PM  [facility] [address] [place], [place] [address] P: [phone] I F: [phone]    [facility] PHYSICAL AND SPORTS MEDICINE [address], [place], [address] Phone: [phone]   Fax:  [phone]  Name: Ethan Arellano MRN: [id number] Date of Birth: 17-Dec-1960

## 2020-08-18 ENCOUNTER — Ambulatory Visit: Payer: Medicaid Other | Attending: Physical Medicine & Rehabilitation | Admitting: Physical Therapy

## 2020-08-18 ENCOUNTER — Encounter: Payer: Self-pay | Admitting: Physical Therapy

## 2020-08-18 DIAGNOSIS — R262 Difficulty in walking, not elsewhere classified: Secondary | ICD-10-CM | POA: Insufficient documentation

## 2020-08-18 DIAGNOSIS — M5441 Lumbago with sciatica, right side: Secondary | ICD-10-CM | POA: Diagnosis present

## 2020-08-18 DIAGNOSIS — M6281 Muscle weakness (generalized): Secondary | ICD-10-CM | POA: Insufficient documentation

## 2020-08-18 NOTE — Therapy (Addendum)
[facility] [address] [place], [address] Phone: [phone]   Fax:  [phone]  Physical Therapy Treatment  Patient Details  Name: Ethan Arellano MRN: [id number] Date of Birth: 12-28-1960 Referring Provider (PT): Ethan Guadalajara, DO   Encounter Date: 08/18/2020   PT End of Session - 08/18/20 1019     Visit Number 4    Number of Visits 24    Date for PT Re-Evaluation 10/30/20    Authorization Type [place] [employer] [place] reporting period from 08/07/2020    Authorization Time Period Max 27 combined PT/OT/SLP per year  Berkley Harvey is not required for first 12 visits, then [place] requried    Authorization - Visit Number 3    Authorization - Number of Visits 12    Progress Note Due on Visit 10    PT Start Time 0900    PT Stop Time 0940    PT Time Calculation (min) 40 min    Activity Tolerance Patient tolerated treatment well             Past Medical History:  Diagnosis Date   Hypertension     Past Surgical History:  Procedure Laterality Date   BLADDER REPAIR     CORONARY ANGIOPLASTY WITH STENT PLACEMENT      There were no vitals filed for this visit.   08/18/20 0956  Symptoms/Limitations  Subjective Patient requested letter from PT informing landlord of care taker/aid at today's session related to his low back pain diagnosis. Patient states that aid has helped him performed ADLs such as driving, cooking, cleaning, getting out of the shower for 3 years. Patient reports that the aid comes 2x a week and will continue to come 2x for 18 months after his knee surgery. Patient states that he drove to today's session, because the aid was unavailable. Patient reports 8/10 pain in R leg. Patient states that he has decreased numbness and pain in R L for an hour post PT sesison. He states that "everthing is going alright" with his HEP.  Pertinent History Patient is a 60 y.o. male who presents to  outpatient physical therapy with a referral for medical diagnosis right lumbar radiculitis with plan to start him in a mechanical diagnosis and treatment approach. This patient's chief complaints consist of constant bilateral low back pain with right radicular symptoms to the toe since 07/21/2020 (with extended history of episodic localized left back pain) leading to the following functional deficits: difficulty with all activiites due to constant pain including, bed mobility, laying down, sleeping, sitting, walking, dressing, stairs, daily activities.   Relevant past medical history and comorbidities include former smoker, HTN, CAD, DM, PAD, pneumonia,  FRACTURE SURGERY left  femur, left lower leg, MANDIBLE SURGERY 1995, hx bladder repair, hx coronary angioplasty with stent placement.  Patient denies hx of cancer, stroke, lung problem, unexplained weight loss, changes in bowel or bladder problems, spine surgery.  Limitations Sitting;Lifting;Standing;Walking;House hold activities  Diagnostic tests CT report 07/22/2020: "FINDINGS:  Segmentation: 5 lumbar type vertebrae.     Alignment: Normal.     Vertebrae: Schmorl's nodes at the inferior inflate of L3 and L5. No  vertebral compression deformities. No focal bone lesion or bone  destruction.     Paraspinal and other soft tissues: No abnormal paraspinal soft  tissue mass or infiltration. Calcification of the aorta without  aneurysm.     Disc levels: Mild degenerative changes with small endplate  osteophyte formation. Disc  space heights are mostly normal. Bulging  disc annuli are suggested at L3-4, L4-5, and L5-S1 causing some  apparent effacement of the anterior thecal sac. Probable effacement  of the right lateral recess at L5-S1.     IMPRESSION:  1. No acute displaced fractures identified.  2. Mild degenerative changes with multilevel disc bulges in the  lower lumbar spine."  Pain Assessment  Currently in Pain? Yes  Pain Score 8  Pain Location Back  Pain  Descriptors / Indicators Numbness  Pain Type Chronic pain  Pain Onset More than a month ago    TREATMENT: Manual therapy: to reduce pain and tissue tension, improve range of motion, neuromodulation, in order to promote improved ability to complete functional activities. - prone CPA grade III-IV to lower thoracic and lumbar segments. Patient reports change in symptoms with repeated motions. However patient symptoms description is unclear.  - prone STM to right glute, posterior thigh, and calf muscles   Therapeutic exercise: to centralize symptoms and improve ROM, strength, muscular endurance, and activity tolerance required for successful completion of functional activities.  - manual therapy (see above). - prone press up 3x10 - quadruped bird dog 2x8 each side - hooklying bridge with hip abduction against blue band around knees, 5 second holds, 3x10 - seated sciatic nerve glide in slump position, R LE, 1x15   Pt required multimodal cuing for proper technique and to facilitate improved neuromuscular control, strength, range of motion, and functional ability resulting in improved performance and form.   HOME EXERCISE PROGRAM Access Code: [email or web address] URL: [email or web address] Date: 08/12/2020 Prepared by: Norton BlizzardSara Snyder   Exercises Seated Slump Nerve Glide - 3 x daily - 15 reps Prone Press Up - 3 x daily - 10-15 reps - 1 second hold Hamstring stretch (with strap) - 1-2 x daily - 3 reps - 30 seconds hold Bridge with Hip Abduction and Resistance - 1-2 x daily - 2 sets - 10 reps - 5 second hold      PT Education - 08/18/20 1018     Education Details Exercise purpose/form. Benefit of movement for self management of back pain.    Person(s) Educated Patient    Methods Explanation;Demonstration;Tactile cues;Verbal cues    Comprehension Verbalized understanding;Returned demonstration;Tactile cues required;Verbal cues required              PT Short Term Goals - 08/07/20  1640       PT SHORT TERM GOAL #1   Title Be independent with initial home exercise program for self-management of symptoms.    Baseline Initial HEP provided at IE (08/07/2020);    Time 2    Period Weeks    Status New    Target Date 08/21/20               PT Long Term Goals - 08/07/20 1641       PT LONG TERM GOAL #1   Title Be independent with a long-term home exercise program for self-management of symptoms.    Baseline Initial HEP provided at IE (08/07/2020);    Time 12    Period Weeks    Status New   TARGET DATE FOR ALL LONG TERM GOALS: 10/30/2020     PT LONG TERM GOAL #2   Title Demonstrate improved FOTO score to equal or greater than 47 by visit #11 to demonstrate improvement in overall condition and self-reported functional ability.    Baseline 16 (08/07/2020);    Time 12    Period Weeks  Status New      PT LONG TERM GOAL #3   Title Have full lumbar AROM with no compensations or increase in pain in all planes except intermittent end range discomfort to allow patient to complete valued activities with less difficulty.    Baseline painful and limited - see objective exam (08/07/2020);    Time 12    Period Weeks    Status New      PT LONG TERM GOAL #4   Title Improve B LE strength to 4+/5 with no increase in pain to improve patient's ability to complete  valued functional tasks such as walking, using stairs, transfers with less difficulty.    Baseline painful and weak - see objective exam (08/07/2020);    Time 12    Period Weeks    Status New      PT LONG TERM GOAL #5   Title Complete community, work and/or recreational activities without limitation due to current condition    Baseline ifficulty with all activiites due to constant pain including, bed mobility, laying down, sleeping, sitting, walking, dressing, stairs, daily activities (08/07/2020);    Time 12    Period Weeks    Status New      Additional Long Term Goals   Additional Long Term Goals Yes      PT  LONG TERM GOAL #6   Title Reduce pain with functional activities to equal or less than 3/10 to allow patient to complete usual activities including ADLs, IADLs, sleeping, and social engagement with less difficulty.    Baseline reported as 10/10 constantly (08/07/2020);    Time 12    Period Weeks    Status New                   Plan - 08/18/20 1020     Clinical Impression Statement Patient tolerated treatment well. Patient symptoms seem to change in response to exercise. however, patient has difficulty describing changes, so symtoms are unclear. Per patient report manual therapy and theraputic exercise decrease patient symptoms and pain for 1 hour post session. Provided patient with letter that he reported being satisfied with (see chart). Patient symptoms continue to impact patient ability to perfom ADLs, such as bathing, driving, cooking, and cleaning. Patient symptoms, decreased strength, and limited functional mobility also limit patient goal of exercising to lose weight before TKA. Patient will benefit from skilled physical therapy intervention to work towards stated goals and return to PLOF or maximal functional independence.    Personal Factors and Comorbidities Age;Behavior Pattern;Comorbidity 3+;Social Background;Past/Current Experience;Fitness;Time since onset of injury/illness/exacerbation    Comorbidities Relevant past medical history and comorbidities include former smoker, HTN, CAD, DM, PAD, pneumonia,  FRACT    Examination-Activity Limitations Bathing;Squat;Lift;Stairs;Bed Mobility;Bend;Locomotion Level;Stand;Carry;Transfers;Sit;Dressing;Sleep    Examination-Participation Restrictions Community Activity;Yard Work;Interpersonal Relationship    Stability/Clinical Decision Making Evolving/Moderate complexity    Rehab Potential Good    PT Frequency 2x / week    PT Duration 12 weeks    PT Treatment/Interventions ADLs/Self Care Home Management;Aquatic Therapy;Cryotherapy;Moist  Heat;Traction;Electrical Stimulation;Gait training;Stair training;Functional mobility training;Therapeutic activities;Therapeutic exercise;Balance training;Neuromuscular re-education;Manual techniques;Dry needling;Passive range of motion;Patient/family education;Spinal Manipulations;Joint Manipulations    PT Next Visit Plan strength/mobility as tolerated, neurodynamics, update HEP as appropriate    PT Home Exercise Plan Medbridge Access Code: [email or web address]    Consulted and Agree with Plan of Care Patient             Patient will benefit from skilled therapeutic intervention in order to improve the following  deficits and impairments:  Abnormal gait, Impaired sensation, Improper body mechanics, Pain, Decreased mobility, Increased muscle spasms, Postural dysfunction, Decreased activity tolerance, Decreased endurance, Decreased range of motion, Decreased strength, Hypomobility, Impaired perceived functional ability, Difficulty walking, Decreased balance  Visit Diagnosis: Bilateral low back pain with right-sided sciatica, unspecified chronicity  Difficulty in walking, not elsewhere classified  Muscle weakness (generalized)     Problem List There are no problems to display for this patient.  Vanessa Barbara, SPT  Student physical therapist under direct supervision of licensed physical therapists during the entirety of the session.   Luretha Murphy. Ilsa Iha, PT, DPT 08/18/20, 1:58 PM  Addendum on 11/17/2021 to correct missing subjective information.   Luretha Murphy. Ilsa Iha, PT, DPT 11/17/21, 4:35 PM   [facility] PHYS[facility] AND SPORTS MEDICINE [address], [place], [address] Phone: [phone]   Fax:  [phone]  Name: Ethan Arellano MRN: [id number] Date of Birth: 04/07/1960

## 2020-08-20 ENCOUNTER — Encounter: Payer: Self-pay | Admitting: Physical Therapy

## 2020-08-20 ENCOUNTER — Ambulatory Visit: Payer: Medicaid Other | Admitting: Physical Therapy

## 2020-08-20 DIAGNOSIS — M5441 Lumbago with sciatica, right side: Secondary | ICD-10-CM | POA: Diagnosis not present

## 2020-08-20 DIAGNOSIS — R262 Difficulty in walking, not elsewhere classified: Secondary | ICD-10-CM

## 2020-08-20 DIAGNOSIS — M6281 Muscle weakness (generalized): Secondary | ICD-10-CM

## 2020-08-20 NOTE — Therapy (Signed)
[facility] [address], [place], [address] Phone: [phone]   Fax:  [phone]  Physical Therapy Treatment  Patient Details  Name: Ethan Arellano MRN: [id number] Date of Birth: [birth year] Referring Provider (PT): Carnella Guadalajara, DO   Encounter Date: 08/20/2020   PT End of Session - 08/20/20 0952     Visit Number 5    Number of Visits 24    Date for PT Re-Evaluation 10/30/20    Authorization Type [place] MEDICAID [employer] OF [place] reporting period from 08/07/2020    Authorization Time Period Max 27 combined PT/OT/SLP per year  Berkley Harvey is not required for first 12 visits, then auth requried    Authorization - Visit Number 4    Authorization - Number of Visits 12    Progress Note Due on Visit 10    PT Start Time 0902    PT Stop Time 0940    PT Time Calculation (min) 38 min    Activity Tolerance Patient tolerated treatment well    Behavior During Therapy [facility] for tasks assessed/performed             Past Medical History:  Diagnosis Date   Hypertension     Past Surgical History:  Procedure Laterality Date   BLADDER REPAIR     CORONARY ANGIOPLASTY WITH STENT PLACEMENT      Subjective Assessment - 08/20/20 0950     Subjective Patient reports 8/10 pain in his R calf. Patient believes that the pain is getting better, because it has moved from his thigh to his calf. Patient also reports that he has been able to sleep better now that the pain has decreased and moved location. Patient reports that standing/pushing off of his toes aggravates his R calf pain and makes it jump up into his thigh. Patient reports appointment with physician on the 8th of August to discuss his knee appointment.    Pertinent History Patient is a 60 y.o. male who presents to outpatient physical therapy with a referral for medical diagnosis right lumbar radiculitis with plan to start him in a mechanical diagnosis and treatment  approach. This patient's chief complaints consist of constant bilateral low back pain with right radicular symptoms to the toe since 07/21/2020 (with extended history of episodic localized left back pain) leading to the following functional deficits: difficulty with all activiites due to constant pain including, bed mobility, laying down, sleeping, sitting, walking, dressing, stairs, daily activities.   Relevant past medical history and comorbidities include former smoker, HTN, CAD, DM, PAD, pneumonia,  FRACTURE SURGERY left  femur, left lower leg, MANDIBLE SURGERY 1995, hx bladder repair, hx coronary angioplasty with stent placement.  Patient denies hx of cancer, stroke, lung problem, unexplained weight loss, changes in bowel or bladder problems, spine surgery.    Limitations Sitting;Lifting;Standing;Walking;House hold activities    Diagnostic tests CT report 07/22/2020: "FINDINGS:  Segmentation: 5 lumbar type vertebrae.     Alignment: Normal.     Vertebrae: Schmorl's nodes at the inferior inflate of L3 and L5. No  vertebral compression deformities. No focal bone lesion or bone  destruction.     Paraspinal and other soft tissues: No abnormal paraspinal soft  tissue mass or infiltration. Calcification of the aorta without  aneurysm.     Disc levels: Mild degenerative changes with small endplate  osteophyte formation. Disc space heights are mostly normal. Bulging  disc annuli are suggested at L3-4, L4-5, and L5-S1 causing some  apparent  effacement of the anterior thecal sac. Probable effacement  of the right lateral recess at L5-S1.     IMPRESSION:  1. No acute displaced fractures identified.  2. Mild degenerative changes with multilevel disc bulges in the  lower lumbar spine."    Currently in Pain? Yes    Pain Score 8     Pain Location Calf    Pain Orientation Right    Pain Type Chronic pain    Pain Radiating Towards down right LE into calf.    Pain Onset More than a month ago    Pain Frequency Constant     Pain Relieving Factors exercises during therapy and movement              There were no vitals filed for this visit.   TREATMENT Therapeutic exercise: to centralize symptoms and improve ROM, strength, muscular endurance, and activity tolerance required for successful completion of functional activities.    -Treadmill at .8 MPH without UE assistance. For improved lower extremity mobility, muscular endurance, and weightbearing activity tolerance; and to induce the analgesic effect of aerobic exercise, stimulate improved joint nutrition, and prepare body structures and systems for following interventions. x 5:40 minutes. Patient wore gait belt for increased safety.  - seated leg press 1x15 with 10lb cable, 1x10 with 35lb cable, 3x10 with 55lb cable - seated hamstring curl on machine with cable, 2x10 with 25lbs, 1x20 with 35lbs.  - quadruped bird dog 2x10 each side - sit to stands from 18.5 inch plinth table, 1 x 10  - hooklying bridge with hip abduction against blue band around knees, 5 second holds, 3x15 - seated sciatic nerve glide in slump position, R LE, 1x15   Pt required multimodal cuing for proper technique and to facilitate improved neuromuscular control, strength, range of motion, and functional ability resulting in improved performance and form.   HOME EXERCISE PROGRAM Access Code: [email or web address] URL: [email or web address] Date: 08/12/2020 Prepared by: Norton Blizzard   Exercises Seated Slump Nerve Glide - 3 x daily - 15 reps Prone Press Up - 3 x daily - 10-15 reps - 1 second hold Hamstring stretch (with strap) - 1-2 x daily - 3 reps - 30 seconds hold Bridge with Hip Abduction and Resistance - 1-2 x daily - 2 sets - 10 reps - 5 second hold        PT Education - 08/20/20 0952     Education Details Exercise purpose/form. Self management techniques    Person(s) Educated Patient    Methods Explanation;Tactile cues;Verbal cues;Demonstration    Comprehension  Verbalized understanding;Verbal cues required;Tactile cues required;Returned demonstration              PT Short Term Goals - 08/20/20 1009       PT SHORT TERM GOAL #1   Title Be independent with initial home exercise program for self-management of symptoms.    Baseline Initial HEP provided at IE (08/07/2020);    Time 2    Period Weeks    Status Achieved    Target Date 08/21/20               PT Long Term Goals - 08/07/20 1641       PT LONG TERM GOAL #1   Title Be independent with a long-term home exercise program for self-management of symptoms.    Baseline Initial HEP provided at IE (08/07/2020);    Time 12    Period Weeks    Status New   TARGET DATE FOR ALL  LONG TERM GOALS: 10/30/2020     PT LONG TERM GOAL #2   Title Demonstrate improved FOTO score to equal or greater than 47 by visit #11 to demonstrate improvement in overall condition and self-reported functional ability.    Baseline 16 (08/07/2020);    Time 12    Period Weeks    Status New      PT LONG TERM GOAL #3   Title Have full lumbar AROM with no compensations or increase in pain in all planes except intermittent end range discomfort to allow patient to complete valued activities with less difficulty.    Baseline painful and limited - see objective exam (08/07/2020);    Time 12    Period Weeks    Status New      PT LONG TERM GOAL #4   Title Improve B LE strength to 4+/5 with no increase in pain to improve patient's ability to complete  valued functional tasks such as walking, using stairs, transfers with less difficulty.    Baseline painful and weak - see objective exam (08/07/2020);    Time 12    Period Weeks    Status New      PT LONG TERM GOAL #5   Title Complete community, work and/or recreational activities without limitation due to current condition    Baseline ifficulty with all activiites due to constant pain including, bed mobility, laying down, sleeping, sitting, walking, dressing, stairs,  daily activities (08/07/2020);    Time 12    Period Weeks    Status New      Additional Long Term Goals   Additional Long Term Goals Yes      PT LONG TERM GOAL #6   Title Reduce pain with functional activities to equal or less than 3/10 to allow patient to complete usual activities including ADLs, IADLs, sleeping, and social engagement with less difficulty.    Baseline reported as 10/10 constantly (08/07/2020);    Time 12    Period Weeks    Status New                   Plan - 08/20/20 0955     Clinical Impression Statement Patient tolerated treatment well and highly motivated to participate in today's session. Patient continues to respond well to exercises per patient report that movement alleviates radiating R LE symptoms. Patient demonstrated improvements in motor control and form while performing bird dog exercise. However, patient pain and LE strength limit patient functional ability and community participation. Plan to provide patient with comprehensive L knee HEP at next session to help patient maximize knee strength, ROM, and functional ability pre TKA.  Patient will continue to benefit from skilled physical therapy services to work towards stated goals and return to PLOF or maximal functional independence.    Personal Factors and Comorbidities Age;Behavior Pattern;Comorbidity 3+;Social Background;Past/Current Experience;Fitness;Time since onset of injury/illness/exacerbation    Comorbidities Relevant past medical history and comorbidities include former smoker, HTN, CAD, DM, PAD, pneumonia,  FRACT    Examination-Activity Limitations Bathing;Squat;Lift;Stairs;Bed Mobility;Bend;Locomotion Level;Stand;Carry;Transfers;Sit;Dressing;Sleep    Examination-Participation Restrictions Community Activity;Yard Work;Interpersonal Relationship    Stability/Clinical Decision Making Evolving/Moderate complexity    Rehab Potential Good    PT Frequency 2x / week    PT Duration 12 weeks    PT  Treatment/Interventions ADLs/Self Care Home Management;Aquatic Therapy;Cryotherapy;Moist Heat;Traction;Electrical Stimulation;Gait training;Stair training;Functional mobility training;Therapeutic activities;Therapeutic exercise;Balance training;Neuromuscular re-education;Manual techniques;Dry needling;Passive range of motion;Patient/family education;Spinal Manipulations;Joint Manipulations    PT Next Visit Plan strength/mobility as tolerated, neurodynamics,  update HEP as appropriate    PT Home Exercise Plan Medbridge Access Code: [email or web address]    Consulted and Agree with Plan of Care Patient             Patient will benefit from skilled therapeutic intervention in order to improve the following deficits and impairments:  Abnormal gait, Impaired sensation, Improper body mechanics, Pain, Decreased mobility, Increased muscle spasms, Postural dysfunction, Decreased activity tolerance, Decreased endurance, Decreased range of motion, Decreased strength, Hypomobility, Impaired perceived functional ability, Difficulty walking, Decreased balance  Visit Diagnosis: Bilateral low back pain with right-sided sciatica, unspecified chronicity  Muscle weakness (generalized)  Difficulty in walking, not elsewhere classified     Problem List There are no problems to display for this patient.  Vanessa Barbara, SPT  Student physical therapist under direct supervision of licensed physical therapists during the entirety of the session.   Luretha Murphy. Ilsa Iha, PT, DPT 08/20/20, 2:20 PM   [facility] PHYSICAL AND SPORTS MEDICINE [address] [place], [address] Phone: [phone]   Fax:  [phone]  Name: Ethan Arellano MRN: [id number] Date of Birth: 1960/07/06

## 2020-08-25 ENCOUNTER — Ambulatory Visit: Payer: Medicaid Other | Admitting: Physical Therapy

## 2020-08-27 ENCOUNTER — Encounter: Payer: Self-pay | Admitting: Physical Therapy

## 2020-08-27 ENCOUNTER — Ambulatory Visit: Payer: Medicaid Other | Admitting: Physical Therapy

## 2020-08-27 DIAGNOSIS — M6281 Muscle weakness (generalized): Secondary | ICD-10-CM

## 2020-08-27 DIAGNOSIS — M5441 Lumbago with sciatica, right side: Secondary | ICD-10-CM | POA: Diagnosis not present

## 2020-08-27 DIAGNOSIS — R262 Difficulty in walking, not elsewhere classified: Secondary | ICD-10-CM

## 2020-08-27 NOTE — Therapy (Signed)
[facility]INE [address] [place], [address] Phone: [phone]   Fax:  [phone]  Physical Therapy Treatment  Patient Details  Name: Ethan Arellano MRN: [id number] Date of Birth: 1960/06/07 Referring Provider (PT): Carnella Guadalajara, DO   Encounter Date: 08/27/2020   PT End of Session - 08/27/20 1004     Visit Number 6    Number of Visits 24    Date for PT Re-Evaluation 10/30/20    Authorization Type [place] MEDICA[employer] [place] reporting period from 08/07/2020    Authorization Time Period Max 27 combined PT/OT/SLP per year  Berkley Harvey is not required for first 12 visits, then [place] requried    Authorization - Visit Number 5    Authorization - Number of Visits 12    Progress Note Due on Visit 10    PT Start Time 0902    PT Stop Time 0944    PT Time Calculation (min) 42 min    Activity Tolerance Patient tolerated treatment well    Behavior During Therapy [facility] for tasks assessed/performed             Past Medical History:  Diagnosis Date   Hypertension     Past Surgical History:  Procedure Laterality Date   BLADDER REPAIR     CORONARY ANGIOPLASTY WITH STENT PLACEMENT      There were no vitals filed for this visit.   Subjective Assessment - 08/27/20 1002     Subjective Patient reports meeting with his orthopedic surgeon on Monday. Per patient report orthopedic surgeon would like him to see his heart specialist, primary care physician, dentist, and leg specialist before clearing him for L TKA. Per patient report, the physician estimates not having the surgery until February. Patient is concerned that shot in his L knee is wearing off and his pain is increasing. Patient does not want to keep getting knee injections. Patient reports 8/10 pain in R posterior thigh. Pain in R leg continues to wake patient up at night. Patient denies being sore after his last PT appointment. Pt states HEP compliance.     Pertinent History Patient is a 60 y.o. male who presents to outpatient physical therapy with a referral for medical diagnosis right lumbar radiculitis with plan to start him in a mechanical diagnosis and treatment approach. This patient's chief complaints consist of constant bilateral low back pain with right radicular symptoms to the toe since 07/21/2020 (with extended history of episodic localized left back pain) leading to the following functional deficits: difficulty with all activiites due to constant pain including, bed mobility, laying down, sleeping, sitting, walking, dressing, stairs, daily activities.   Relevant past medical history and comorbidities include former smoker, HTN, CAD, DM, PAD, pneumonia,  FRACTURE SURGERY left  femur, left lower leg, MANDIBLE SURGERY 1995, hx bladder repair, hx coronary angioplasty with stent placement.  Patient denies hx of cancer, stroke, lung problem, unexplained weight loss, changes in bowel or bladder problems, spine surgery.    Limitations Sitting;Lifting;Standing;Walking;House hold activities    Diagnostic tests CT report 07/22/2020: "FINDINGS:  Segmentation: 5 lumbar type vertebrae.     Alignment: Normal.     Vertebrae: Schmorl's nodes at the inferior inflate of L3 and L5. No  vertebral compression deformities. No focal bone lesion or bone  destruction.     Paraspinal and other soft tissues: No abnormal paraspinal soft  tissue mass or infiltration. Calcification of the aorta without  aneurysm.  Disc levels: Mild degenerative changes with small endplate  osteophyte formation. Disc space heights are mostly normal. Bulging  disc annuli are suggested at L3-4, L4-5, and L5-S1 causing some  apparent effacement of the anterior thecal sac. Probable effacement  of the right lateral recess at L5-S1.     IMPRESSION:  1. No acute displaced fractures identified.  2. Mild degenerative changes with multilevel disc bulges in the  lower lumbar spine."    Currently in Pain? Yes     Pain Score 8     Pain Location Leg    Pain Orientation Posterior;Right    Pain Type Chronic pain    Pain Onset More than a month ago    Pain Frequency Constant              TREATMENT Therapeutic exercise: to centralize symptoms and improve ROM, strength, muscular endurance, and activity tolerance required for successful completion of functional activities.     -Treadmill at 1 MPH with bilateral UE assistance. For improved lower extremity mobility, muscular endurance, and weightbearing activity tolerance; and to induce the analgesic effect of aerobic exercise, stimulate improved joint nutrition, and prepare body structures and systems for following interventions. x 5:20 minutes.  - seated leg press 1x10 with 55lb cable, 2x10 with 65lb cable, 1x10 with 75lb cable, 1x15 with 75lb cable. - seated hamstring curl on machine with cable, 3x12 with 45lbs - seated ankle pumps 1x10 performed bilaterally - hook lying quad sets 3x10 performed on L leg - supine straight leg raise 3x10 performed on L leg -supine heel slides 3x10 performed on L leg -supine hip abduction 1x30  performed on L leg - seated long arc quad 1x 30 on L leg. - seated sciatic nerve glide in slump position, R LE, 1x15   Pt required multimodal cuing for proper technique and to facilitate improved neuromuscular control, strength, range of motion, and functional ability resulting in improved performance and form.    HOME EXERCISE PROGRAM Access Code: [email or web address] URL: [email or web address] Date: 08/12/2020 Prepared by: Norton Blizzard   Exercises Seated Slump Nerve Glide - 3 x daily - 15 reps Prone Press Up - 3 x daily - 10-15 reps - 1 second hold Hamstring stretch (with strap) - 1-2 x daily - 3 reps - 30 seconds hold Bridge with Hip Abduction and Resistance - 1-2 x daily - 2 sets - 10 reps - 5 second hold      PT Education - 08/27/20 1003     Education Details Exercise purpose/form. Self management techniques     Person(s) Educated Patient    Methods Explanation;Demonstration;Tactile cues;Verbal cues;Handout    Comprehension Verbalized understanding;Returned demonstration;Verbal cues required;Tactile cues required;Need further instruction              PT Short Term Goals - 08/20/20 1009       PT SHORT TERM GOAL #1   Title Be independent with initial home exercise program for self-management of symptoms.    Baseline Initial HEP provided at IE (08/07/2020);    Time 2    Period Weeks    Status Achieved    Target Date 08/21/20               PT Long Term Goals - 08/07/20 1641       PT LONG TERM GOAL #1   Title Be independent with a long-term home exercise program for self-management of symptoms.    Baseline Initial HEP provided at IE (08/07/2020);    Time 12  Period Weeks    Status New   TARGET DATE FOR ALL LONG TERM GOALS: 10/30/2020     PT LONG TERM GOAL #2   Title Demonstrate improved FOTO score to equal or greater than 47 by visit #11 to demonstrate improvement in overall condition and self-reported functional ability.    Baseline 16 (08/07/2020);    Time 12    Period Weeks    Status New      PT LONG TERM GOAL #3   Title Have full lumbar AROM with no compensations or increase in pain in all planes except intermittent end range discomfort to allow patient to complete valued activities with less difficulty.    Baseline painful and limited - see objective exam (08/07/2020);    Time 12    Period Weeks    Status New      PT LONG TERM GOAL #4   Title Improve B LE strength to 4+/5 with no increase in pain to improve patient's ability to complete  valued functional tasks such as walking, using stairs, transfers with less difficulty.    Baseline painful and weak - see objective exam (08/07/2020);    Time 12    Period Weeks    Status New      PT LONG TERM GOAL #5   Title Complete community, work and/or recreational activities without limitation due to current condition     Baseline ifficulty with all activiites due to constant pain including, bed mobility, laying down, sleeping, sitting, walking, dressing, stairs, daily activities (08/07/2020);    Time 12    Period Weeks    Status New      Additional Long Term Goals   Additional Long Term Goals Yes      PT LONG TERM GOAL #6   Title Reduce pain with functional activities to equal or less than 3/10 to allow patient to complete usual activities including ADLs, IADLs, sleeping, and social engagement with less difficulty.    Baseline reported as 10/10 constantly (08/07/2020);    Time 12    Period Weeks    Status New                 Plan - 08/27/20 1009     Clinical Impression Statement Patient tolerated treatment well and reports decrease in symptoms post PT session. Patient responded well to progression of L strengthening exercises demonstrating improvements in LE strength and activity tolerance. Patient L knee pain and ROM continues to impact patient mobility and plan of care. TKA preparatory intevensions were included in today's session to address L knee limitations and minimize L knee impact on patient LB pain and plan of care. Patient will continue to benefit from skilled physical therapy intervention to address impairments and maximize functional independence.    Personal Factors and Comorbidities Age;Behavior Pattern;Comorbidity 3+;Social Background;Past/Current Experience;Fitness;Time since onset of injury/illness/exacerbation    Comorbidities Relevant past medical history and comorbidities include former smoker, HTN, CAD, DM, PAD, pneumonia,  FRACT    Examination-Activity Limitations Bathing;Squat;Lift;Stairs;Bed Mobility;Bend;Locomotion Level;Stand;Carry;Transfers;Sit;Dressing;Sleep    Examination-Participation Restrictions Community Activity;Yard Work;Interpersonal Relationship    Stability/Clinical Decision Making Evolving/Moderate complexity    Rehab Potential Good    PT Frequency 2x / week     PT Duration 12 weeks    PT Treatment/Interventions ADLs/Self Care Home Management;Aquatic Therapy;Cryotherapy;Moist Heat;Traction;Electrical Stimulation;Gait training;Stair training;Functional mobility training;Therapeutic activities;Therapeutic exercise;Balance training;Neuromuscular re-education;Manual techniques;Dry needling;Passive range of motion;Patient/family education;Spinal Manipulations;Joint Manipulations    PT Next Visit Plan strength/mobility as tolerated, neurodynamics, update HEP as appropriate  PT Home Exercise Plan Medbridge Access Code: [email or web address]    Consulted and Agree with Plan of Care Patient             Patient will benefit from skilled therapeutic intervention in order to improve the following deficits and impairments:  Abnormal gait, Impaired sensation, Improper body mechanics, Pain, Decreased mobility, Increased muscle spasms, Postural dysfunction, Decreased activity tolerance, Decreased endurance, Decreased range of motion, Decreased strength, Hypomobility, Impaired perceived functional ability, Difficulty walking, Decreased balance  Visit Diagnosis: Bilateral low back pain with right-sided sciatica, unspecified chronicity  Muscle weakness (generalized)  Difficulty in walking, not elsewhere classified     Problem List There are no problems to display for this patient.   Vanessa Barbara, SPT  Student physical therapist under direct supervision of licensed physical therapists during the entirety of the session.   Luretha Murphy. Ilsa Iha, PT, DPT 08/27/20, 12:06 PM   [facility] SPORTS MEDICINE [address], [place], [address] Phone: [phone]   Fax:  [phone]  Name: Ethan Arellano MRN: [id number] Date of Birth: 1960/09/14

## 2020-09-01 ENCOUNTER — Other Ambulatory Visit: Payer: Self-pay

## 2020-09-01 ENCOUNTER — Ambulatory Visit: Payer: Medicaid Other | Admitting: Physical Therapy

## 2020-09-01 ENCOUNTER — Encounter: Payer: Self-pay | Admitting: Physical Therapy

## 2020-09-01 DIAGNOSIS — M6281 Muscle weakness (generalized): Secondary | ICD-10-CM

## 2020-09-01 DIAGNOSIS — R262 Difficulty in walking, not elsewhere classified: Secondary | ICD-10-CM

## 2020-09-01 DIAGNOSIS — M5441 Lumbago with sciatica, right side: Secondary | ICD-10-CM | POA: Diagnosis not present

## 2020-09-01 NOTE — Therapy (Signed)
[facility] [address] [place], [address] Phone: [phone]   Fax:  [phone]  Physical Therapy Treatment  Patient Details  Name: Ethan Arellano MRN: [id number] Date of Birth: 08-02-1960 Referring Provider (PT): Carnella Guadalajara, DO   Encounter Date: 09/01/2020   PT End of Session - 09/01/20 1332     Visit Number 7    Number of Visits 24    Date for PT Re-Evaluation 10/30/20    Authorization Type [place] MEDICAID [employer] [place] reporting period from 08/07/2020    Authorization Time Period Max 27 combined PT/OT/SLP per year  Berkley Harvey is not required for first 12 visits, then [place] requried    Authorization - Visit Number 6    Authorization - Number of Visits 12    Progress Note Due on Visit 10    PT Start Time 0902    PT Stop Time [id number]    PT Time Calculation (min) 40 min    Equipment Utilized During Treatment Gait belt    Activity Tolerance Patient tolerated treatment well    Behavior During Therapy WFL for tasks assessed/performed             Past Medical History:  Diagnosis Date   Hypertension     Past Surgical History:  Procedure Laterality Date   BLADDER REPAIR     CORONARY ANGIOPLASTY WITH STENT PLACEMENT      There were no vitals filed for this visit.   Subjective Assessment - 09/01/20 0928     Subjective Patient is doing well today. He believes weights are helping him. Patient denies back pain and states that pain has just been in his R calf for a long time. Patient does report increased pain in L knee. Patient believes pain injection is wearing off. Patient is unable to provide pain rating, but states that L knee pain shoots up into his leg bone. Patient reports being able to sleep better since PT started due to reduction in R leg symptoms.    Pertinent History Patient is a 60 y.o. male who presents to outpatient physical therapy with a referral for medical diagnosis right lumbar  radiculitis with plan to start him in a mechanical diagnosis and treatment approach. This patient's chief complaints consist of constant bilateral low back pain with right radicular symptoms to the toe since 07/21/2020 (with extended history of episodic localized left back pain) leading to the following functional deficits: difficulty with all activiites due to constant pain including, bed mobility, laying down, sleeping, sitting, walking, dressing, stairs, daily activities.   Relevant past medical history and comorbidities include former smoker, HTN, CAD, DM, PAD, pneumonia,  FRACTURE SURGERY left  femur, left lower leg, MANDIBLE SURGERY 1995, hx bladder repair, hx coronary angioplasty with stent placement.  Patient denies hx of cancer, stroke, lung problem, unexplained weight loss, changes in bowel or bladder problems, spine surgery.    Limitations Sitting;Lifting;Standing;Walking;House hold activities    Diagnostic tests CT report 07/22/2020: "FINDINGS:  Segmentation: 5 lumbar type vertebrae.     Alignment: Normal.     Vertebrae: Schmorl's nodes at the inferior inflate of L3 and L5. No  vertebral compression deformities. No focal bone lesion or bone  destruction.     Paraspinal and other soft tissues: No abnormal paraspinal soft  tissue mass or infiltration. Calcification of the aorta without  aneurysm.     Disc levels: Mild degenerative changes with small endplate  osteophyte formation. Disc space heights  are mostly normal. Bulging  disc annuli are suggested at L3-4, L4-5, and L5-S1 causing some  apparent effacement of the anterior thecal sac. Probable effacement  of the right lateral recess at L5-S1.     IMPRESSION:  1. No acute displaced fractures identified.  2. Mild degenerative changes with multilevel disc bulges in the  lower lumbar spine."    Currently in Pain? Yes    Pain Location Knee    Pain Orientation Left    Pain Onset More than a month ago             TREATMENT Therapeutic exercise: to  centralize symptoms and improve ROM, strength, muscular endurance, and activity tolerance required for successful completion of functional activities.     -Treadmill at 1.1 MPH. For improved lower extremity mobility, muscular endurance, and weightbearing activity tolerance; and to induce the analgesic effect of aerobic exercise, stimulate improved joint nutrition, and prepare body structures and systems for following interventions. x 5 minutes.  - seated leg press 2x12 with 85lb cable, 1x12 95lb cable.  - seated hamstring curl on machine with cable, 3x12 with 45lbs - pallof press 3x12 each side with blue theraband. - sit to stands from 18.5 inch plinth table holding 5lb weight, 3 x 15, holding 7lb weight  - standing heel raises with UE support from plinth 2x10 - standing calf stretch 3x15 seconds. Performed bilaterally.     Pt required multimodal cuing for proper technique and to facilitate improved neuromuscular control, strength, range of motion, and functional ability resulting in improved performance and form.     HOME EXERCISE PROGRAM Access Code: [email or web address] URL: [email or web address] Date: 08/12/2020 Prepared by: Norton BlizzardSara Snyder   Exercises Seated Slump Nerve Glide - 3 x daily - 15 reps Prone Press Up - 3 x daily - 10-15 reps - 1 second hold Hamstring stretch (with strap) - 1-2 x daily - 3 reps - 30 seconds hold Bridge with Hip Aduction and Resistance - 1-2 x daily - 2 sets - 10 reps - 5 second hold      PT Education - 09/01/20 1332     Education Details Exercise purpose/form. Self management techniques.    Person(s) Educated Patient    Methods Explanation;Demonstration;Tactile cues;Verbal cues    Comprehension Verbalized understanding;Returned demonstration;Tactile cues required;Verbal cues required              PT Short Term Goals - 08/20/20 1009       PT SHORT TERM GOAL #1   Title Be independent with initial home exercise program for self-management of  symptoms.    Baseline Initial HEP provided at IE (08/07/2020);    Time 2    Period Weeks    Status Achieved    Target Date 08/21/20               PT Long Term Goals - 08/07/20 1641       PT LONG TERM GOAL #1   Title Be independent with a long-term home exercise program for self-management of symptoms.    Baseline Initial HEP provided at IE (08/07/2020);    Time 12    Period Weeks    Status New   TARGET DATE FOR ALL LONG TERM GOALS: 10/30/2020     PT LONG TERM GOAL #2   Title Demonstrate improved FOTO score to equal or greater than 47 by visit #11 to demonstrate improvement in overall condition and self-reported functional ability.    Baseline 16 (08/07/2020);    Time 12  Period Weeks    Status New      PT LONG TERM GOAL #3   Title Have full lumbar AROM with no compensations or increase in pain in all planes except intermittent end range discomfort to allow patient to complete valued activities with less difficulty.    Baseline painful and limited - see objective exam (08/07/2020);    Time 12    Period Weeks    Status New      PT LONG TERM GOAL #4   Title Improve B LE strength to 4+/5 with no increase in pain to improve patient's ability to complete  valued functional tasks such as walking, using stairs, transfers with less difficulty.    Baseline painful and weak - see objective exam (08/07/2020);    Time 12    Period Weeks    Status New      PT LONG TERM GOAL #5   Title Complete community, work and/or recreational activities without limitation due to current condition    Baseline ifficulty with all activiites due to constant pain including, bed mobility, laying down, sleeping, sitting, walking, dressing, stairs, daily activities (08/07/2020);    Time 12    Period Weeks    Status New      Additional Long Term Goals   Additional Long Term Goals Yes      PT LONG TERM GOAL #6   Title Reduce pain with functional activities to equal or less than 3/10 to allow patient to  complete usual activities including ADLs, IADLs, sleeping, and social engagement with less difficulty.    Baseline reported as 10/10 constantly (08/07/2020);    Time 12    Period Weeks    Status New                Plan - 09/01/20 1333     Clinical Impression Statement Patient tolerated treatment well today and reports decreased in symptoms post PT session.  Patient had difficulty maintaining neutral position while performing pallof press indicating limited core and back strength. However, patient continued to respond well to progression of LE strengtheing exercises. Plan for further encorporation of dynamic core/back strengthening exercises at next session. Patient continues to benefit from skilled physical therapy intervention to address impairments and maximize functional independence.    Personal Factors and Comorbidities Age;Behavior Pattern;Comorbidity 3+;Social Background;Past/Current Experience;Fitness;Time since onset of injury/illness/exacerbation    Comorbidities Relevant past medical history and comorbidities include former smoker, HTN, CAD, DM, PAD, pneumonia,  FRACT    Examination-Activity Limitations Bathing;Squat;Lift;Stairs;Bed Mobility;Bend;Locomotion Level;Stand;Carry;Transfers;Sit;Dressing;Sleep    Examination-Participation Restrictions Community Activity;Yard Work;Interpersonal Relationship    Stability/Clinical Decision Making Evolving/Moderate complexity    Rehab Potential Good    PT Frequency 2x / week    PT Duration 12 weeks    PT Treatment/Interventions ADLs/Self Care Home Management;Aquatic Therapy;Cryotherapy;Moist Heat;Traction;Electrical Stimulation;Gait training;Stair training;Functional mobility training;Therapeutic activities;Therapeutic exercise;Balance training;Neuromuscular re-education;Manual techniques;Dry needling;Passive range of motion;Patient/family education;Spinal Manipulations;Joint Manipulations    PT Next Visit Plan strength/mobility as  tolerated, neurodynamics, update HEP as appropriate    PT Home Exercise Plan Medbridge Access Code: [email or web address]    Consulted and Agree with Plan of Care Patient             Patient will benefit from skilled therapeutic intervention in order to improve the following deficits and impairments:  Abnormal gait, Impaired sensation, Improper body mechanics, Pain, Decreased mobility, Increased muscle spasms, Postural dysfunction, Decreased activity tolerance, Decreased endurance, Decreased range of motion, Decreased strength, Hypomobility, Impaired perceived functional ability, Difficulty walking, Decreased  balance  Visit Diagnosis: Bilateral low back pain with right-sided sciatica, unspecified chronicity  Muscle weakness (generalized)  Difficulty in walking, not elsewhere classified     Problem List There are no problems to display for this patient.   Vanessa Barbara, SPT  Student physical therapist under direct supervision of licensed physical therapists during the entirety of the session.   Luretha Murphy. Ilsa Iha, PT, DPT 09/01/20, 3:35 PM   [facility] PHYSICAL AND SPORTS MEDICINE [address], [place], [address] Phone: [phone]   Fax:  [phone]  Name: Ethan Arellano MRN: [id number] Date of Birth: [birth year]

## 2020-09-04 ENCOUNTER — Ambulatory Visit: Payer: Medicaid Other

## 2020-09-04 DIAGNOSIS — R262 Difficulty in walking, not elsewhere classified: Secondary | ICD-10-CM

## 2020-09-04 DIAGNOSIS — M5441 Lumbago with sciatica, right side: Secondary | ICD-10-CM | POA: Diagnosis not present

## 2020-09-04 DIAGNOSIS — M6281 Muscle weakness (generalized): Secondary | ICD-10-CM

## 2020-09-04 NOTE — Therapy (Signed)
[facility] MEDICINE [address], [place], [address] Phone: [phone]   Fax:  [phone]  Physical Therapy Treatment  Patient Details  Name: Ethan Arellano MRN: [id number] Date of Birth: 12/05/1960 Referring Provider (PT): Carnella Guadalajara, DO   Encounter Date: 09/04/2020   PT End of Session - 09/04/20 1027     Visit Number 8    Number of Visits 24    Date for PT Re-Evaluation 10/30/20    Authorization Type [place] MEDICA[employer] OF [place] reporting period from 08/07/2020    Authorization Time Period Max 27 combined PT/OT/SLP per year  Berkley Harvey is not required for first 12 visits, then [place] requried    Authorization - Visit Number 7    Authorization - Number of Visits 12    Progress Note Due on Visit 10    PT Start Time [phone]    PT Stop Time 1019    PT Time Calculation (min) 31 min    Equipment Utilized During Treatment Gait belt    Activity Tolerance Patient tolerated treatment well    Behavior During Therapy WFL for tasks assessed/performed             Past Medical History:  Diagnosis Date   Hypertension     Past Surgical History:  Procedure Laterality Date   BLADDER REPAIR     CORONARY ANGIOPLASTY WITH STENT PLACEMENT      There were no vitals filed for this visit.   Subjective Assessment - 09/04/20 1025     Subjective Patient is feeling okay today. Patient states that he has been doing all exercises at home including the squats. Patient reports sleeping better since starting PT. Patient reports 8/10 constant pain in R leg specifically in calf ( N/T pins and needles pain).    Pertinent History Patient is a 60 y.o. male who presents to outpatient physical therapy with a referral for medical diagnosis right lumbar radiculitis with plan to start him in a mechanical diagnosis and treatment approach. This patient's chief complaints consist of constant bilateral low back pain with right radicular  symptoms to the toe since 07/21/2020 (with extended history of episodic localized left back pain) leading to the following functional deficits: difficulty with all activiites due to constant pain including, bed mobility, laying down, sleeping, sitting, walking, dressing, stairs, daily activities.   Relevant past medical history and comorbidities include former smoker, HTN, CAD, DM, PAD, pneumonia,  FRACTURE SURGERY left  femur, left lower leg, MANDIBLE SURGERY 1995, hx bladder repair, hx coronary angioplasty with stent placement.  Patient denies hx of cancer, stroke, lung problem, unexplained weight loss, changes in bowel or bladder problems, spine surgery.    Limitations Sitting;Lifting;Standing;Walking;House hold activities    Diagnostic tests CT report 07/22/2020: "FINDINGS:  Segmentation: 5 lumbar type vertebrae.     Alignment: Normal.     Vertebrae: Schmorl's nodes at the inferior inflate of L3 and L5. No  vertebral compression deformities. No focal bone lesion or bone  destruction.     Paraspinal and other soft tissues: No abnormal paraspinal soft  tissue mass or infiltration. Calcification of the aorta without  aneurysm.     Disc levels: Mild degenerative changes with small endplate  osteophyte formation. Disc space heights are mostly normal. Bulging  disc annuli are suggested at L3-4, L4-5, and L5-S1 causing some  apparent effacement of the anterior thecal sac. Probable effacement  of the right lateral recess at L5-S1.  IMPRESSION:  1. No acute displaced fractures identified.  2. Mild degenerative changes with multilevel disc bulges in the  lower lumbar spine."    Currently in Pain? Yes    Pain Score 8     Pain Location Leg    Pain Orientation Right    Pain Type Chronic pain    Pain Onset More than a month ago    Pain Frequency Constant              TREATMENT:  Therapeutic exercise: to centralize symptoms and improve ROM, strength, muscular endurance, and activity tolerance required for  successful completion of functional activities.   - seated leg press 1x15 with 95lb cable. 2x15 with 105lbs  - seated hamstring curl on machine with cable, 3x12 with 45lbs - pallof press 3x15 each side with blue theraband. Cues not to extend back into lumbar lordosis as compensatory mechanism for core weakness. Fair carryover with cuing.  - sit to stands from 18.5 inch plinth table holding 5lb weight, 4 x 15, holding 8lb weight   Pt required multimodal cuing for proper technique and to facilitate improved neuromuscular control, strength, range of motion, and functional ability resulting in improved performance and form.    HOME EXERCISE PROGRAM Access Code: [email or web address] URL: [email or web address] Date: 08/12/2020 Prepared by: Norton BlizzardSara Snyder   Exercises Seated Slump Nerve Glide - 3 x daily - 15 reps Prone Press Up - 3 x daily - 10-15 reps - 1 second hold Hamstring stretch (with strap) - 1-2 x daily - 3 reps - 30 seconds hold Bridge with Hip Aduction and Resistance - 1-2 x daily - 2 sets - 10 reps - 5 second hold       PT Education - 09/04/20 1027     Education Details Exercise purpose/form. Self management techniques    Person(s) Educated Patient    Methods Explanation;Demonstration;Tactile cues;Verbal cues    Comprehension Returned demonstration;Verbalized understanding;Verbal cues required;Tactile cues required              PT Short Term Goals - 08/20/20 1009       PT SHORT TERM GOAL #1   Title Be independent with initial home exercise program for self-management of symptoms.    Baseline Initial HEP provided at IE (08/07/2020);    Time 2    Period Weeks    Status Achieved    Target Date 08/21/20               PT Long Term Goals - 08/07/20 1641       PT LONG TERM GOAL #1   Title Be independent with a long-term home exercise program for self-management of symptoms.    Baseline Initial HEP provided at IE (08/07/2020);    Time 12    Period Weeks     Status New   TARGET DATE FOR ALL LONG TERM GOALS: 10/30/2020     PT LONG TERM GOAL #2   Title Demonstrate improved FOTO score to equal or greater than 47 by visit #11 to demonstrate improvement in overall condition and self-reported functional ability.    Baseline 16 (08/07/2020);    Time 12    Period Weeks    Status New      PT LONG TERM GOAL #3   Title Have full lumbar AROM with no compensations or increase in pain in all planes except intermittent end range discomfort to allow patient to complete valued activities with less difficulty.    Baseline painful and limited - see  objective exam (08/07/2020);    Time 12    Period Weeks    Status New      PT LONG TERM GOAL #4   Title Improve B LE strength to 4+/5 with no increase in pain to improve patient's ability to complete  valued functional tasks such as walking, using stairs, transfers with less difficulty.    Baseline painful and weak - see objective exam (08/07/2020);    Time 12    Period Weeks    Status New      PT LONG TERM GOAL #5   Title Complete community, work and/or recreational activities without limitation due to current condition    Baseline ifficulty with all activiites due to constant pain including, bed mobility, laying down, sleeping, sitting, walking, dressing, stairs, daily activities (08/07/2020);    Time 12    Period Weeks    Status New      Additional Long Term Goals   Additional Long Term Goals Yes      PT LONG TERM GOAL #6   Title Reduce pain with functional activities to equal or less than 3/10 to allow patient to complete usual activities including ADLs, IADLs, sleeping, and social engagement with less difficulty.    Baseline reported as 10/10 constantly (08/07/2020);    Time 12    Period Weeks    Status New                   Plan - 09/04/20 1029     Clinical Impression Statement Patient tolerated treatment well and reports decrease in symptoms post PT session. Patient continued to responded  well to progression of LE strengthening exercises overall. However, patient continues to demonstrate limited calf strength and would benefit from incoportation of single leg calf strengthening exercises at next session. Pallof press was not progressed at this session due to limited core strength and patient difficulty mainting neutral spine alignment while performing task. Patient will continue to benefit from skilled physical therapy to address impairments and maximize functional independence.    Personal Factors and Comorbidities Age;Behavior Pattern;Comorbidity 3+;Social Background;Past/Current Experience;Fitness;Time since onset of injury/illness/exacerbation    Comorbidities Relevant past medical history and comorbidities include former smoker, HTN, CAD, DM, PAD, pneumonia,  FRACT    Examination-Activity Limitations Bathing;Squat;Lift;Stairs;Bed Mobility;Bend;Locomotion Level;Stand;Carry;Transfers;Sit;Dressing;Sleep    Examination-Participation Restrictions Community Activity;Yard Work;Interpersonal Relationship    Stability/Clinical Decision Making Evolving/Moderate complexity    Rehab Potential Good    PT Frequency 2x / week    PT Duration 12 weeks    PT Treatment/Interventions ADLs/Self Care Home Management;Aquatic Therapy;Cryotherapy;Moist Heat;Traction;Electrical Stimulation;Gait training;Stair training;Functional mobility training;Therapeutic activities;Therapeutic exercise;Balance training;Neuromuscular re-education;Manual techniques;Dry needling;Passive range of motion;Patient/family education;Spinal Manipulations;Joint Manipulations    PT Next Visit Plan strength/mobility as tolerated, neurodynamics, update HEP as appropriate    PT Home Exercise Plan Medbridge Access Code: [email or web address]    Consulted and Agree with Plan of Care Patient             Patient will benefit from skilled therapeutic intervention in order to improve the following deficits and impairments:  Abnormal gait, Impaired  sensation, Improper body mechanics, Pain, Decreased mobility, Increased muscle spasms, Postural dysfunction, Decreased activity tolerance, Decreased endurance, Decreased range of motion, Decreased strength, Hypomobility, Impaired perceived functional ability, Difficulty walking, Decreased balance  Visit Diagnosis: Bilateral low back pain with right-sided sciatica, unspecified chronicity  Muscle weakness (generalized)  Difficulty in walking, not elsewhere classified     Problem List There are no problems to display for this patient.  Vanessa Barbara, SPT   Delphia Grates. Fairly IV, PT, DPT Physical Therapist- [place]  [facility]  09/04/2020, 11:29 AM  [facility] PHYSICAL AND SPORTS MEDICINE [address], [place], [address] Phone: [phone]   Fax:  [phone]  Name: Ethan Arellano MRN: [id number] Date of Birth: 1960-10-18

## 2020-09-08 ENCOUNTER — Encounter: Payer: Self-pay | Admitting: Physical Therapy

## 2020-09-08 ENCOUNTER — Ambulatory Visit: Payer: Medicaid Other | Admitting: Physical Therapy

## 2020-09-08 DIAGNOSIS — M6281 Muscle weakness (generalized): Secondary | ICD-10-CM

## 2020-09-08 DIAGNOSIS — M5441 Lumbago with sciatica, right side: Secondary | ICD-10-CM

## 2020-09-08 DIAGNOSIS — R262 Difficulty in walking, not elsewhere classified: Secondary | ICD-10-CM

## 2020-09-08 NOTE — Therapy (Signed)
[facility] [address], [place], [address] Phone: [phone]   Fax:  [phone]  Physical Therapy Treatment  Patient Details  Name: Ethan Arellano MRN: [id number] Date of Birth: Sep 16, 1960 Referring Provider (PT): Carnella Guadalajara, DO   Encounter Date: 09/08/2020   PT End of Session - 09/08/20 1002     Visit Number 9    Number of Visits 24    Date for PT Re-Evaluation 10/30/20    Authorization Type [place] MEDICAID [employer] OF [place] reporting period from 08/07/2020    Authorization Time Period Max 27 combined PT/OT/SLP per year  Berkley Harvey is not required for first 12 visits, then auth requried    Authorization - Visit Number 9    Authorization - Number of Visits 12    Progress Note Due on Visit 10    PT Start Time [phone]    PT Stop Time 1026    PT Time Calculation (min) 38 min    Equipment Utilized During Treatment Gait belt    Activity Tolerance Patient tolerated treatment well    Behavior During Therapy WFL for tasks assessed/performed             Past Medical History:  Diagnosis Date   Hypertension     Past Surgical History:  Procedure Laterality Date   BLADDER REPAIR     CORONARY ANGIOPLASTY WITH STENT PLACEMENT      There were no vitals filed for this visit.    Subjective Assessment - 09/08/20 0957     Subjective Patient is doing okay. Patient reports that L knee is hurting more than R leg currently. Patient reports 6/10 pain in R leg and 10/10 pain in L knee.    Pertinent History Patient is a 60 y.o. male who presents to outpatient physical therapy with a referral for medical diagnosis right lumbar radiculitis with plan to start him in a mechanical diagnosis and treatment approach. This patient's chief complaints consist of constant bilateral low back pain with right radicular symptoms to the toe since 07/21/2020 (with extended history of episodic localized left back pain) leading to the  following functional deficits: difficulty with all activiites due to constant pain including, bed mobility, laying down, sleeping, sitting, walking, dressing, stairs, daily activities.   Relevant past medical history and comorbidities include former smoker, HTN, CAD, DM, PAD, pneumonia,  FRACTURE SURGERY left  femur, left lower leg, MANDIBLE SURGERY 1995, hx bladder repair, hx coronary angioplasty with stent placement.  Patient denies hx of cancer, stroke, lung problem, unexplained weight loss, changes in bowel or bladder problems, spine surgery.    Limitations Sitting;Lifting;Standing;Walking;House hold activities    Diagnostic tests CT report 07/22/2020: "FINDINGS:  Segmentation: 5 lumbar type vertebrae.     Alignment: Normal.     Vertebrae: Schmorl's nodes at the inferior inflate of L3 and L5. No  vertebral compression deformities. No focal bone lesion or bone  destruction.     Paraspinal and other soft tissues: No abnormal paraspinal soft  tissue mass or infiltration. Calcification of the aorta without  aneurysm.     Disc levels: Mild degenerative changes with small endplate  osteophyte formation. Disc space heights are mostly normal. Bulging  disc annuli are suggested at L3-4, L4-5, and L5-S1 causing some  apparent effacement of the anterior thecal sac. Probable effacement  of the right lateral recess at L5-S1.     IMPRESSION:  1. No acute displaced fractures identified.  2. Mild degenerative changes  with multilevel disc bulges in the  lower lumbar spine."    Currently in Pain? Yes    Pain Score 6     Pain Location Leg    Pain Orientation Right    Pain Onset More than a month ago    Pain Frequency Constant             TREATMENT:  Therapeutic exercise: to centralize symptoms and improve ROM, strength, muscular endurance, and activity tolerance required for successful completion of functional activities.   -Treadmill at 1.1 MPH. For improved lower extremity mobility, muscular endurance, and  weightbearing activity tolerance; and to induce the analgesic effect of aerobic exercise, stimulate improved joint nutrition, and prepare body structures and systems for following interventions. x 5 minutes.  - farmer's carry with 10lb kettle bell 4x100 feet. Patient held [employer] in R hand on 1st and 3rd set. Patient carried kettle bell in L hand in 2nd and 4th set. Patient required cuing to maintain upright/neutral trunk positioning.  - seated leg press 4x15 with 105lbs  - seated hamstring curl on machine with cable, 4x15 with 45lbs - standing bilateral heel raise with single leg eccentric lowering 1x10 performed bilaterally - sit to stands from 18.5 inch plinth table holding 5lb weight, 4 x 10, holding 10lb weight    Pt required multimodal cuing for proper technique and to facilitate improved neuromuscular control, strength, range of motion, and functional ability resulting in improved performance and form.      HOME EXERCISE PROGRAM Access Code: [email or web address] URL: [email or web address] Date: 08/12/2020 Prepared by: Norton Blizzard   Exercises Seated Slump Nerve Glide - 3 x daily - 15 reps Prone Press Up - 3 x daily - 10-15 reps - 1 second hold Hamstring stretch (with strap) - 1-2 x daily - 3 reps - 30 seconds hold Bridge with Hip Abduction and Resistance - 1-2 x daily - 2 sets - 10 reps - 5 second hold      PT Education - 09/08/20 1000     Education Details Exercise purpose/form.Self management techniques    Person(s) Educated Patient    Methods Explanation;Tactile cues;Verbal cues;Demonstration    Comprehension Verbalized understanding;Returned demonstration;Tactile cues required;Verbal cues required              PT Short Term Goals - 08/20/20 1009       PT SHORT TERM GOAL #1   Title Be independent with initial home exercise program for self-management of symptoms.    Baseline Initial HEP provided at IE (08/07/2020);    Time 2    Period Weeks    Status  Achieved    Target Date 08/21/20               PT Long Term Goals - 08/07/20 1641       PT LONG TERM GOAL #1   Title Be independent with a long-term home exercise program for self-management of symptoms.    Baseline Initial HEP provided at IE (08/07/2020);    Time 12    Period Weeks    Status New   TARGET DATE FOR ALL LONG TERM GOALS: 10/30/2020     PT LONG TERM GOAL #2   Title Demonstrate improved FOTO score to equal or greater than 47 by visit #11 to demonstrate improvement in overall condition and self-reported functional ability.    Baseline 16 (08/07/2020);    Time 12    Period Weeks    Status New      PT LONG  TERM GOAL #3   Title Have full lumbar AROM with no compensations or increase in pain in all planes except intermittent end range discomfort to allow patient to complete valued activities with less difficulty.    Baseline painful and limited - see objective exam (08/07/2020);    Time 12    Period Weeks    Status New      PT LONG TERM GOAL #4   Title Improve B LE strength to 4+/5 with no increase in pain to improve patient's ability to complete  valued functional tasks such as walking, using stairs, transfers with less difficulty.    Baseline painful and weak - see objective exam (08/07/2020);    Time 12    Period Weeks    Status New      PT LONG TERM GOAL #5   Title Complete community, work and/or recreational activities without limitation due to current condition    Baseline ifficulty with all activiites due to constant pain including, bed mobility, laying down, sleeping, sitting, walking, dressing, stairs, daily activities (08/07/2020);    Time 12    Period Weeks    Status New      Additional Long Term Goals   Additional Long Term Goals Yes      PT LONG TERM GOAL #6   Title Reduce pain with functional activities to equal or less than 3/10 to allow patient to complete usual activities including ADLs, IADLs, sleeping, and social engagement with less  difficulty.    Baseline reported as 10/10 constantly (08/07/2020);    Time 12    Period Weeks    Status New                   Plan - 09/08/20 1251     Clinical Impression Statement Patient tolerated treatment well and reports decrease in symptoms post PT session. Patient responded well to progression of LE and quad/hip strengthening exercises demonstrating improvements in activity tolerance and LE strength. Patient continues to demonstrated limited core strength and difficulty maintaining neutral trunk alignment during therapeutic exercise. Patient will continue to benefit from skilled physical therapy intervention to address impairments and maximize functional independence.    Personal Factors and Comorbidities Age;Behavior Pattern;Comorbidity 3+;Social Background;Past/Current Experience;Fitness;Time since onset of injury/illness/exacerbation    Comorbidities Relevant past medical history and comorbidities include former smoker, HTN, CAD, DM, PAD, pneumonia,  FRACT    Examination-Activity Limitations Bathing;Squat;Lift;Stairs;Bed Mobility;Bend;Locomotion Level;Stand;Carry;Transfers;Sit;Dressing;Sleep    Examination-Participation Restrictions Community Activity;Yard Work;Interpersonal Relationship    Stability/Clinical Decision Making Evolving/Moderate complexity    Rehab Potential Good    PT Frequency 2x / week    PT Duration 12 weeks    PT Treatment/Interventions ADLs/Self Care Home Management;Aquatic Therapy;Cryotherapy;Moist Heat;Traction;Electrical Stimulation;Gait training;Stair training;Functional mobility training;Therapeutic activities;Therapeutic exercise;Balance training;Neuromuscular re-education;Manual techniques;Dry needling;Passive range of motion;Patient/family education;Spinal Manipulations;Joint Manipulations    PT Next Visit Plan strength/mobility as tolerated, neurodynamics, update HEP as appropriate    PT Home Exercise Plan Medbridge Access Code: [email or web address]     Consulted and Agree with Plan of Care Patient             Patient will benefit from skilled therapeutic intervention in order to improve the following deficits and impairments:  Abnormal gait, Impaired sensation, Improper body mechanics, Pain, Decreased mobility, Increased muscle spasms, Postural dysfunction, Decreased activity tolerance, Decreased endurance, Decreased range of motion, Decreased strength, Hypomobility, Impaired perceived functional ability, Difficulty walking, Decreased balance  Visit Diagnosis: Bilateral low back pain with right-sided sciatica, unspecified chronicity  Muscle weakness (generalized)  Difficulty in walking, not elsewhere classified     Problem List There are no problems to display for this patient.  Vanessa Barbaraaylor Leotta Weingarten, SPT  Student physical therapist under direct supervision of licensed physical therapists during the entirety of the session.   Luretha MurphySara R. Ilsa IhaSnyder, PT, DPT 09/08/20, 1:57 PM   [facility]TS MEDICINE [address] [place], [place], [address] Phone: [phone]   Fax:  [phone]  Name: Ethan Arellano MRN: [id number] Date of Birth: [birth year]

## 2020-09-10 ENCOUNTER — Ambulatory Visit: Payer: Medicaid Other | Admitting: Physical Therapy

## 2020-09-10 ENCOUNTER — Encounter: Payer: Self-pay | Admitting: Physical Therapy

## 2020-09-10 DIAGNOSIS — M6281 Muscle weakness (generalized): Secondary | ICD-10-CM

## 2020-09-10 DIAGNOSIS — R262 Difficulty in walking, not elsewhere classified: Secondary | ICD-10-CM

## 2020-09-10 DIAGNOSIS — M5441 Lumbago with sciatica, right side: Secondary | ICD-10-CM | POA: Diagnosis not present

## 2020-09-10 NOTE — Therapy (Addendum)
[facility] [address], [place], [address] Phone: [phone]   Fax:  [phone]  Physical Therapy Treatment/Discharge Summary:  Reporting Period from 08/07/2020 - 09/10/2020  Patient Details  Name: Ethan Arellano MRN: [id number] Date of Birth: 1960/02/15 Referring Provider (PT): Louis Meckel, DO   Encounter Date: 09/10/2020   PT End of Session - 09/10/20 1807     Visit Number 10    Number of Visits 24    Date for PT Re-Evaluation 10/30/20    Authorization Type [place] MEDICAID [employer] [place] reporting period from 08/07/2020    Authorization Time Period Max 27 combined PT/OT/SLP per year  Josem Kaufmann is not required for first 12 visits, then [place] - Visit Number 10    Authorization - Number of Visits 12    Progress Note Due on Visit 10    PT Start Time [phone]    PT Stop Time 1026    PT Time Calculation (min) 38 min    Equipment Utilized During Treatment Gait belt    Activity Tolerance Patient tolerated treatment well    Behavior During Therapy WFL for tasks assessed/performed             Past Medical History:  Diagnosis Date   Hypertension     Past Surgical History:  Procedure Laterality Date   BLADDER REPAIR     CORONARY ANGIOPLASTY WITH STENT PLACEMENT      There were no vitals filed for this visit.   Subjective Assessment - 09/10/20 1056     Subjective Patient is doing well. Patient reports L knee pain but does not provide a L knee pain rating. Patient report 8/10 R leg that radiates into R calf. Patient describes numbness and pins and needles feeling in R foot and leg. Patient states that HEP has building hip and leg strength up. Patient believes that PT has been very helpful and feels ready for discharge today.    Pertinent History Patient is a 60 y.o. male who presents to outpatient physical therapy with a referral for medical diagnosis right lumbar radiculitis  with plan to start him in a mechanical diagnosis and treatment approach. This patient's chief complaints consist of constant bilateral low back pain with right radicular symptoms to the toe since 07/21/2020 (with extended history of episodic localized left back pain) leading to the following functional deficits: difficulty with all activiites due to constant pain including, bed mobility, laying down, sleeping, sitting, walking, dressing, stairs, daily activities.   Relevant past medical history and comorbidities include former smoker, HTN, CAD, DM, PAD, pneumonia,  FRACTURE SURGERY left  femur, left lower leg, MANDIBLE SURGERY 1995, hx bladder repair, hx coronary angioplasty with stent placement.  Patient denies hx of cancer, stroke, lung problem, unexplained weight loss, changes in bowel or bladder problems, spine surgery.    Limitations Sitting;Lifting;Standing;Walking;House hold activities    Diagnostic tests CT report 07/22/2020: "FINDINGS:  Segmentation: 5 lumbar type vertebrae.     Alignment: Normal.     Vertebrae: Schmorl's nodes at the inferior inflate of L3 and L5. No  vertebral compression deformities. No focal bone lesion or bone  destruction.     Paraspinal and other soft tissues: No abnormal paraspinal soft  tissue mass or infiltration. Calcification of the aorta without  aneurysm.     Disc levels: Mild degenerative changes with small endplate  osteophyte formation. Disc space heights are mostly normal. Bulging  disc annuli  are suggested at L3-4, L4-5, and L5-S1 causing some  apparent effacement of the anterior thecal sac. Probable effacement  of the right lateral recess at L5-S1.     IMPRESSION:  1. No acute displaced fractures identified.  2. Mild degenerative changes with multilevel disc bulges in the  lower lumbar spine."    Currently in Pain? Yes    Pain Score 8     Pain Location Leg    Pain Orientation Right    Pain Descriptors / Indicators Numbness    Pain Type Chronic pain;Neuropathic pain     Pain Onset More than a month ago    Pain Frequency Constant            OBJECTIVE:  FOTO Score: 62  LUMBAR ROM Flexion: 100%, Pt. denies LB or radiating R leg pain with motion.  Extension: 80%. Pt. denies LB or radiating R leg pain with motion. Patient reports tightness in bilateral hip flexors with motion.  Side Bending: L = 100%, R = 100%. Patient denies LB or radiating R leg pain with motion.  Side Gliding R: WFL - Patient denies LB or radiating R leg pain with motion.  Side Gliding L: WFL - Patient denies LB or radiating R leg pain with motion.   STRENGTH (MMT):  Hip        Flexion: R = 5/5, L = 5/5. Abduction: R = 5/5, L = 5/5. Knee Ext: R = 4+/5, L = 5/5. Flex: R = 5/5, L = 5/5. Ankle (seated position) Dorsiflexion: R = 5/5, L = 4+/5. Plantarflexion: R = 4/5, L = 4+/5. Eversion: R = 5/5, L = 5/5. Great toe ext: R = 5/5, L = 5/5.   TREATMENT:  Therapeutic exercise: to centralize symptoms and improve ROM, strength, muscular endurance, and activity tolerance required for successful completion of functional activities.   - Objective measurements completed on examination: See above findings. -Treadmill at 1.1 MPH. For improved lower extremity mobility, muscular endurance, and weightbearing activity tolerance; and to induce the analgesic effect of aerobic exercise, stimulate improved joint nutrition, and prepare body structures and systems for following interventions. x 5 minutes.  - farmer's carry with 10lb kettle bell 4x100 feet. Patient held [employer] in R hand on 1st and 3rd set. Patient carried kettle bell in L hand in 2nd and 4th set. Patient required cuing to maintain upright/neutral trunk positioning.  - seated leg press 2x15 with 105lbs  - seated hamstring curl on machine with cable, 2x15 with 45lbs - Education on long term HEP including handout    Pt required multimodal cuing for proper technique and to facilitate improved neuromuscular control, strength, range  of motion, and functional ability resulting in improved performance and form.   HOME EXERCISE PROGRAM Access Code: [email or web address] URL: [email or web address] Date: 09/11/2020  Prepared by: Norton Blizzard  Exercises  Seated Slump Nerve Glide - 3 x daily - 15 reps  Prone Press Up - 3 x daily - 10-15 reps - 1 second hold  Hamstring stretch (with strap) - 1-2 x daily - 3 reps - 30 seconds hold  Bridge with Hip Abduction and Resistance - Ground Touches - 1-2 x daily - 2 sets - 10 reps - 5 seconds hold  Kettlebell Suitcase Carry - 4 sets - feet 100 Full Leg Press - 4 sets - 15 reps  Hamstring Curl with Weight Machine - 4 sets - 15 reps  Bird Dog - 1 x daily - 4 sets - 10 reps  PT Education - 09/10/20 1806     Education Details Exercise purpose/form.Self management techniques. Comprehensive HEP handout and instructions.    Person(s) Educated Patient    Methods Explanation;Demonstration;Tactile cues;Verbal cues;Handout    Comprehension Verbalized understanding;Returned demonstration;Verbal cues required;Tactile cues required              PT Short Term Goals - 08/20/20 1009       PT SHORT TERM GOAL #1   Title Be independent with initial home exercise program for self-management of symptoms.    Baseline Initial HEP provided at IE (08/07/2020);    Time 2    Period Weeks    Status Achieved    Target Date 08/21/20               PT Long Term Goals - 09/10/20 1045       PT LONG TERM GOAL #1   Title Be independent with a long-term home exercise program for self-management of symptoms.    Baseline Initial HEP provided at IE (08/07/2020);    Time 12    Period Weeks    Status Achieved   TARGET DATE FOR ALL LONG TERM GOALS: 10/30/2020     PT LONG TERM GOAL #2   Title Demonstrate improved FOTO score to equal or greater than 47 by visit #11 to demonstrate improvement in overall condition and self-reported functional ability.    Baseline 16 (08/07/2020); 62 (09/10/2020)    Time  12    Period Weeks    Status Achieved      PT LONG TERM GOAL #3   Title Have full lumbar AROM with no compensations or increase in pain in all planes except intermittent end range discomfort to allow patient to complete valued activities with less difficulty.    Baseline painful and limited - see objective exam (08/07/2020); tight bilateral hip flexors limit patient extension - however patient demonstrated full AROM of all other lumbar motions. Patient denied pain in all lumbar planes of motion.    Time 12    Period Weeks    Status Partially Met      PT LONG TERM GOAL #4   Title Improve B LE strength to 4+/5 with no increase in pain to improve patient's ability to complete  valued functional tasks such as walking, using stairs, transfers with less difficulty.    Baseline painful and weak - see objective exam (08/07/2020); B LE strength 4+/5 or greater except for R PF = 4/5 (09/10/2020)    Time 12    Period Weeks    Status Partially Met      PT LONG TERM GOAL #5   Title Complete community, work and/or recreational activities without limitation due to current condition    Baseline difficulty with all activiites due to constant pain including, bed mobility, laying down, sleeping, sitting, walking, dressing, stairs, daily activities (08/07/2020); patient reports decrease in pain and improvement in functional ability with all of the above.    Time 12    Period Weeks    Status Partially Met      PT LONG TERM GOAL #6   Title Reduce pain with functional activities to equal or less than 3/10 to allow patient to complete usual activities including ADLs, IADLs, sleeping, and social engagement with less difficulty.    Baseline reported as 10/10 constantly (08/07/2020); Patient best pain 6/10 (09/10/2020)    Time 12    Period Weeks    Status Partially Met  Plan - 09/10/20 1948     Clinical Impression Statement Patient has attended 10 skilled physical therapy treatment  sessions this episode of care and has met or nearly met all of his stated goals in his POC. Significant improvement in FOTO score from 16 (08/07/2020) to 62 (09/10/2020) indicate patient improvement in overall condition and patient-reported functional ability. Additionally patient demonstrated significant improvements in lumbar AROM without pain demonstrating improvements in mobility and activity tolerance since starting PT. Patient has made significant improvements in LE strength per objective MMT measurements as well. While patient LB and R leg symptoms and pain continue to impact patient ability to perform community and reactional activity, patient responds well to movement and patient reports confidence in his ability to self manage symptoms. Patient provided with updated comprehensive long term HEP during today's session. Patient is now discharged form physical therapy to independent management of his condition.    Personal Factors and Comorbidities Age;Behavior Pattern;Comorbidity 3+;Social Background;Past/Current Experience;Fitness;Time since onset of injury/illness/exacerbation    Comorbidities Relevant past medical history and comorbidities include former smoker, HTN, CAD, DM, PAD, pneumonia,  FRACT    Examination-Activity Limitations Bathing;Squat;Lift;Stairs;Bed Mobility;Bend;Locomotion Level;Stand;Carry;Transfers;Sit;Dressing;Sleep    Examination-Participation Restrictions Community Activity;Yard Work;Interpersonal Relationship    Stability/Clinical Decision Making Evolving/Moderate complexity    Rehab Potential Good    PT Frequency 2x / week    PT Duration 12 weeks    PT Treatment/Interventions ADLs/Self Care Home Management;Aquatic Therapy;Cryotherapy;Moist Heat;Traction;Electrical Stimulation;Gait training;Stair training;Functional mobility training;Therapeutic activities;Therapeutic exercise;Balance training;Neuromuscular re-education;Manual techniques;Dry needling;Passive range of  motion;Patient/family education;Spinal Manipulations;Joint Manipulations    PT Next Visit Plan Patient discharged at today's session.    PT Home Exercise Plan Medbridge Access Code: [email or web address]    Consulted and Agree with Plan of Care Patient             Patient will benefit from skilled therapeutic intervention in order to improve the following deficits and impairments:  Abnormal gait, Impaired sensation, Improper body mechanics, Pain, Decreased mobility, Increased muscle spasms, Postural dysfunction, Decreased activity tolerance, Decreased endurance, Decreased range of motion, Decreased strength, Hypomobility, Impaired perceived functional ability, Difficulty walking, Decreased balance  Visit Diagnosis: Bilateral low back pain with right-sided sciatica, unspecified chronicity  Muscle weakness (generalized)  Difficulty in walking, not elsewhere classified     Problem List There are no problems to display for this patient.  Sherryll Burger, SPT  Student physical therapist under direct supervision of licensed physical therapists during the entirety of the session.   Everlean Alstrom. Graylon Good, PT, DPT 09/10/20, 8:27 PM  [facility] [address], [place], [address] Phone: [phone]   Fax:  [phone]  Name: Ethan Arellano MRN: [id number] Date of Birth: [birth year]

## 2020-09-18 ENCOUNTER — Encounter: Payer: Medicaid Other | Admitting: Physical Therapy

## 2020-09-23 ENCOUNTER — Encounter: Payer: Medicaid Other | Admitting: Physical Therapy

## 2020-09-25 ENCOUNTER — Encounter: Payer: Medicaid Other | Admitting: Physical Therapy

## 2021-09-07 ENCOUNTER — Encounter: Payer: Self-pay | Admitting: Family Medicine

## 2021-09-07 ENCOUNTER — Ambulatory Visit: Payer: Medicaid Other | Admitting: Family Medicine

## 2021-09-07 DIAGNOSIS — A539 Syphilis, unspecified: Secondary | ICD-10-CM | POA: Insufficient documentation

## 2021-09-07 DIAGNOSIS — Z113 Encounter for screening for infections with a predominantly sexual mode of transmission: Secondary | ICD-10-CM | POA: Diagnosis not present

## 2021-09-07 MED ORDER — PENICILLIN G BENZATHINE 2400000 UNIT/4ML IM SUSY
2.4000 10*6.[IU] | PREFILLED_SYRINGE | Freq: Once | INTRAMUSCULAR | Status: AC
  Administered 2021-09-07: 2400000 [IU] via INTRAMUSCULAR

## 2021-09-07 NOTE — Progress Notes (Signed)
Patient seen for syphilis tx. Patient declines allergies. Patient declined STD testing. Educated on abstaining from sex post injection.

## 2021-09-07 NOTE — Progress Notes (Signed)
[facility] Department STI clinic/screening visit  Subjective:  Ethan Arellano is a 61 y.o. male being seen today for an STI screening visit. The patient reports they do not have symptoms.    Patient has the following medical conditions:  There are no problems to display for this patient.    Chief Complaint  Patient presents with   SEXUALLY TRANSMITTED DISEASE    No S/S    HPI  Patient reports he is here for treatment of syphilis.  Client was evaluated for rash/lesion on his penis.  He states that he was given a cream and that rash resolved.  He was notified by [facility] a few days later that he needed treated for syphilis- he received his treatment on  08/11/2021.  States his partner was not treated at this time.  States they were sexually active 3 weeks after his treatment.  Client states her was contacted recently by Blima Singer (DIS) and was counseled that he needed re-treatment and that partner needed testing and treatment.   H e states that he has not symptoms and rash as resolved.  Does the patient or their partner desires a pregnancy in the next year? No  Screening for MPX risk: Does the patient have an unexplained rash? No Is the patient MSM? No Does the patient endorse multiple sex partners or anonymous sex partners? No Did the patient have close or sexual contact with a person diagnosed with MPX? No Has the patient traveled outside the [place] where MPX is endemic? No Is there a high clinical suspicion for MPX-- evidenced by one of the following No  -Unlikely to be chickenpox  -Lymphadenopathy  -Rash that present in same phase of evolution on any given body part   See flowsheet for further details and programmatic requirements.   Immunization History  Administered Date(s) Administered   [employer] Vaccination 08/06/2019, 09/03/2019     The following portions of the patient's history were reviewed and updated as appropriate: allergies, current medications, past  medical history, past social history, past surgical history and problem list.  Objective:  There were no vitals filed for this visit.  Physical Exam- client declines exam and testing, treatment only    Assessment and Plan:  Ethan Arellano is a 61 y.o. male presenting to the [facility] Department for STI screening   1. Screening examination for venereal disease  -Client states that his partner is here for evaluation and  treatment.  2. Syphilis  -Pencillin G Benzathine SUSY 2,400,000 units Co. No to be sexually active x 2 weeks.  Notify clinic for concerns.   Return if symptoms worsen or fail to improve.  No future appointments.  Ethan Pickett, FNP

## 2021-10-30 ENCOUNTER — Encounter: Payer: Self-pay | Admitting: Emergency Medicine

## 2021-10-30 ENCOUNTER — Other Ambulatory Visit: Payer: Self-pay

## 2021-10-30 ENCOUNTER — Emergency Department: Payer: Medicaid Other

## 2021-10-30 ENCOUNTER — Emergency Department
Admission: EM | Admit: 2021-10-30 | Discharge: 2021-10-30 | Disposition: A | Discharge status: Home / Self Care | Payer: Medicaid Other | Attending: Emergency Medicine | Admitting: Emergency Medicine

## 2021-10-30 DIAGNOSIS — Y92410 Unspecified street and highway as the place of occurrence of the external cause: Secondary | ICD-10-CM | POA: Insufficient documentation

## 2021-10-30 DIAGNOSIS — S161XXA Strain of muscle, fascia and tendon at neck level, initial encounter: Secondary | ICD-10-CM

## 2021-10-30 DIAGNOSIS — S199XXA Unspecified injury of neck, initial encounter: Secondary | ICD-10-CM | POA: Diagnosis present

## 2021-10-30 DIAGNOSIS — I10 Essential (primary) hypertension: Secondary | ICD-10-CM | POA: Insufficient documentation

## 2021-10-30 MED ORDER — OXYCODONE-ACETAMINOPHEN 5-325 MG PO TABS
1.0000 | ORAL_TABLET | Freq: Once | ORAL | Status: AC
  Administered 2021-10-30: 1 via ORAL
  Filled 2021-10-30: qty 1

## 2021-10-30 MED ORDER — CYCLOBENZAPRINE HCL 10 MG PO TABS: 10.0000 mg | ORAL_TABLET | Freq: Three times a day (TID) | ORAL | 0 refills | Status: AC | PRN

## 2021-10-30 NOTE — Discharge Instructions (Addendum)
Pain control:  Ibuprofen (motrin/aleve/advil) - You can take 3-4 tablets (600-800 mg) every 6 hours as needed for pain/fever.  Acetaminophen (tylenol) - You can take 2 extra strength tablets (1000 mg) every 6 hours as needed for pain/fever.  You can alternate these medications or take them together.  Make sure you eat food/drink water when taking these medications.  Cyclbenzoprine (Flexeril) - You can take 1/2 to 1 tablet (5-10 mg) every 8 hours as needed for muscle strain/spasm.  Do not drive or work on this medication.  This medication can cause you to feel tired.
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
## 2021-10-30 NOTE — ED Notes (Signed)
In CT and Xray
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
## 2021-11-17 ENCOUNTER — Other Ambulatory Visit: Payer: Self-pay

## 2021-11-17 ENCOUNTER — Encounter: Payer: Self-pay | Admitting: Physical Therapy

## 2021-11-17 ENCOUNTER — Ambulatory Visit: Payer: Medicaid Other | Attending: Family Medicine | Admitting: Physical Therapy

## 2021-11-17 DIAGNOSIS — M5459 Other low back pain: Secondary | ICD-10-CM | POA: Diagnosis present

## 2021-11-17 DIAGNOSIS — M542 Cervicalgia: Secondary | ICD-10-CM | POA: Diagnosis present
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
## 2021-11-24 ENCOUNTER — Ambulatory Visit: Payer: Medicaid Other | Attending: Family Medicine | Admitting: Physical Therapy

## 2021-11-24 DIAGNOSIS — M6281 Muscle weakness (generalized): Secondary | ICD-10-CM | POA: Insufficient documentation

## 2021-11-24 DIAGNOSIS — M5441 Lumbago with sciatica, right side: Secondary | ICD-10-CM | POA: Diagnosis present

## 2021-11-24 DIAGNOSIS — M5459 Other low back pain: Secondary | ICD-10-CM

## 2021-11-24 DIAGNOSIS — R262 Difficulty in walking, not elsewhere classified: Secondary | ICD-10-CM | POA: Insufficient documentation

## 2021-11-24 DIAGNOSIS — M542 Cervicalgia: Secondary | ICD-10-CM
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
## 2021-11-26 ENCOUNTER — Encounter: Payer: Self-pay | Admitting: Physical Therapy

## 2021-11-26 ENCOUNTER — Ambulatory Visit: Payer: Medicaid Other | Admitting: Physical Therapy

## 2021-11-26 DIAGNOSIS — M542 Cervicalgia: Secondary | ICD-10-CM

## 2021-11-26 DIAGNOSIS — M5459 Other low back pain: Secondary | ICD-10-CM
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
## 2021-11-30 ENCOUNTER — Ambulatory Visit: Payer: Medicaid Other | Admitting: Physical Therapy

## 2021-11-30 ENCOUNTER — Encounter: Payer: Self-pay | Admitting: Physical Therapy

## 2021-11-30 DIAGNOSIS — M5459 Other low back pain: Secondary | ICD-10-CM

## 2021-11-30 DIAGNOSIS — M542 Cervicalgia: Secondary | ICD-10-CM | POA: Diagnosis not present
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
## 2021-12-02 ENCOUNTER — Ambulatory Visit: Payer: Medicaid Other | Admitting: Physical Therapy

## 2021-12-03 ENCOUNTER — Ambulatory Visit: Payer: Medicaid Other | Admitting: Physical Therapy

## 2021-12-03 DIAGNOSIS — M5459 Other low back pain: Secondary | ICD-10-CM

## 2021-12-03 DIAGNOSIS — M542 Cervicalgia: Secondary | ICD-10-CM
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
## 2021-12-07 ENCOUNTER — Encounter: Payer: Self-pay | Admitting: Physical Therapy

## 2021-12-07 ENCOUNTER — Ambulatory Visit: Payer: Medicaid Other | Admitting: Physical Therapy

## 2021-12-07 DIAGNOSIS — M542 Cervicalgia: Secondary | ICD-10-CM

## 2021-12-07 DIAGNOSIS — M5459 Other low back pain: Secondary | ICD-10-CM

## 2021-12-07 DIAGNOSIS — M5441 Lumbago with sciatica, right side: Secondary | ICD-10-CM
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
## 2021-12-09 ENCOUNTER — Ambulatory Visit: Payer: Medicaid Other | Admitting: Physical Therapy

## 2021-12-09 ENCOUNTER — Encounter: Payer: Self-pay | Admitting: Physical Therapy

## 2021-12-09 DIAGNOSIS — M542 Cervicalgia: Secondary | ICD-10-CM

## 2021-12-09 DIAGNOSIS — M5459 Other low back pain: Secondary | ICD-10-CM

## 2021-12-09 DIAGNOSIS — R262 Difficulty in walking, not elsewhere classified: Secondary | ICD-10-CM
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
## 2021-12-14 ENCOUNTER — Ambulatory Visit: Payer: Medicaid Other

## 2021-12-14 ENCOUNTER — Encounter: Payer: Self-pay | Admitting: Physical Therapy

## 2021-12-14 DIAGNOSIS — M542 Cervicalgia: Secondary | ICD-10-CM

## 2021-12-14 DIAGNOSIS — R262 Difficulty in walking, not elsewhere classified: Secondary | ICD-10-CM

## 2021-12-14 DIAGNOSIS — M5459 Other low back pain: Secondary | ICD-10-CM

## 2021-12-14 DIAGNOSIS — M5441 Lumbago with sciatica, right side: Secondary | ICD-10-CM

## 2021-12-14 DIAGNOSIS — M6281 Muscle weakness (generalized): Secondary | ICD-10-CM
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
## 2021-12-17 ENCOUNTER — Encounter: Payer: Self-pay | Admitting: Physical Therapy

## 2021-12-17 ENCOUNTER — Ambulatory Visit: Payer: Medicaid Other

## 2021-12-17 DIAGNOSIS — M542 Cervicalgia: Secondary | ICD-10-CM | POA: Diagnosis not present

## 2021-12-17 DIAGNOSIS — M6281 Muscle weakness (generalized): Secondary | ICD-10-CM

## 2021-12-17 DIAGNOSIS — R262 Difficulty in walking, not elsewhere classified: Secondary | ICD-10-CM

## 2021-12-17 DIAGNOSIS — M5441 Lumbago with sciatica, right side: Secondary | ICD-10-CM

## 2021-12-17 DIAGNOSIS — M5459 Other low back pain: Secondary | ICD-10-CM
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
## 2021-12-21 ENCOUNTER — Encounter: Payer: Self-pay | Admitting: Physical Therapy

## 2021-12-21 ENCOUNTER — Ambulatory Visit: Payer: Medicaid Other | Attending: Family Medicine

## 2021-12-21 DIAGNOSIS — R262 Difficulty in walking, not elsewhere classified: Secondary | ICD-10-CM | POA: Insufficient documentation

## 2021-12-21 DIAGNOSIS — M5459 Other low back pain: Secondary | ICD-10-CM | POA: Diagnosis present

## 2021-12-21 DIAGNOSIS — M6281 Muscle weakness (generalized): Secondary | ICD-10-CM | POA: Diagnosis present

## 2021-12-21 DIAGNOSIS — M542 Cervicalgia: Secondary | ICD-10-CM | POA: Insufficient documentation

## 2021-12-21 DIAGNOSIS — M5441 Lumbago with sciatica, right side: Secondary | ICD-10-CM | POA: Insufficient documentation
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
## 2021-12-24 ENCOUNTER — Ambulatory Visit: Payer: Medicaid Other

## 2021-12-24 DIAGNOSIS — M5459 Other low back pain: Secondary | ICD-10-CM

## 2021-12-24 DIAGNOSIS — M542 Cervicalgia: Secondary | ICD-10-CM | POA: Diagnosis not present

## 2021-12-24 DIAGNOSIS — R262 Difficulty in walking, not elsewhere classified: Secondary | ICD-10-CM
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
## 2021-12-28 ENCOUNTER — Encounter: Payer: Self-pay | Admitting: Physical Therapy

## 2021-12-28 ENCOUNTER — Ambulatory Visit: Payer: Medicaid Other

## 2021-12-28 DIAGNOSIS — M6281 Muscle weakness (generalized): Secondary | ICD-10-CM

## 2021-12-28 DIAGNOSIS — M542 Cervicalgia: Secondary | ICD-10-CM | POA: Diagnosis not present

## 2021-12-28 DIAGNOSIS — M5459 Other low back pain: Secondary | ICD-10-CM

## 2021-12-28 DIAGNOSIS — R262 Difficulty in walking, not elsewhere classified: Secondary | ICD-10-CM

## 2021-12-28 DIAGNOSIS — M5441 Lumbago with sciatica, right side: Secondary | ICD-10-CM
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
## 2022-04-08 IMAGING — CT CT ABD-PELV W/O CM
2 of 4 series · 16 of 46 positions shown, 18 images · non-contrast
Comparison: None.

CLINICAL DATA: Vomiting, abdominal pain, and right leg pain.

EXAM:
CT ABDOMEN AND PELVIS WITHOUT CONTRAST
TECHNIQUE: Multidetector CT imaging of the abdomen and pelvis was performed
following the standard protocol without IV contrast.

[Series 2: routine abd/pel wo · axial · 0.86mm/px · z∈[-584,-89]mm · 13 of 109 slices shown, 15 images]
[im 5/109  soft-tissue]
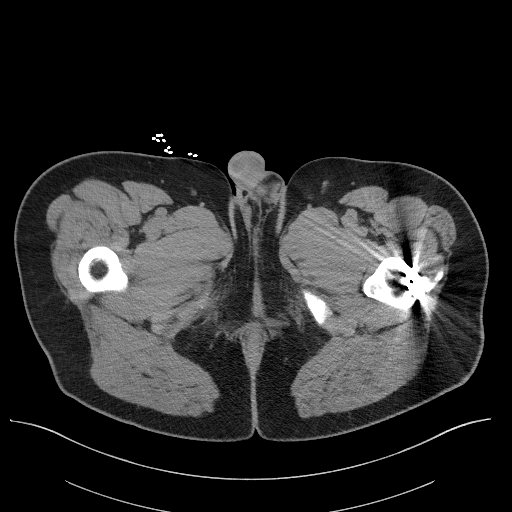
[im 5/109  bone]
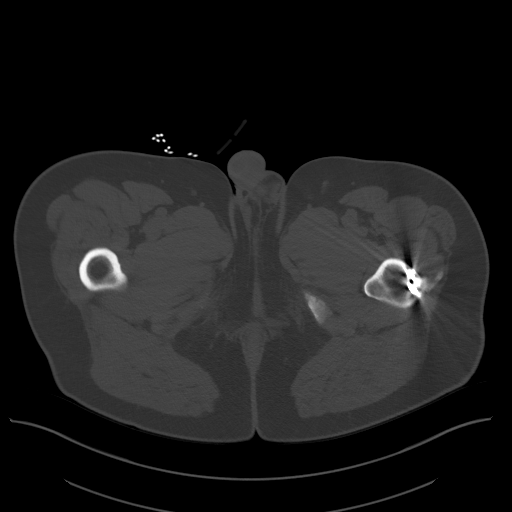
[im 13/109  soft-tissue]
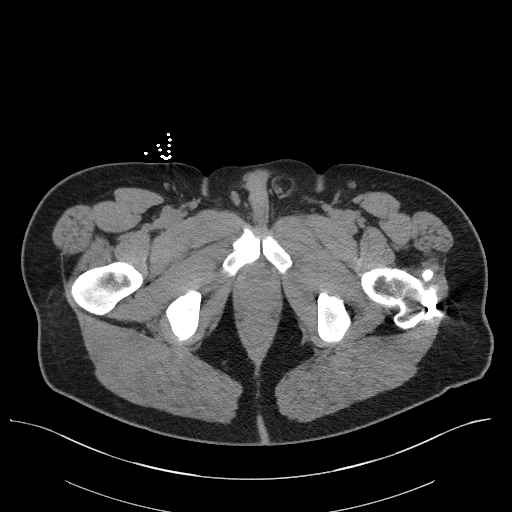
[im 22/109  soft-tissue]
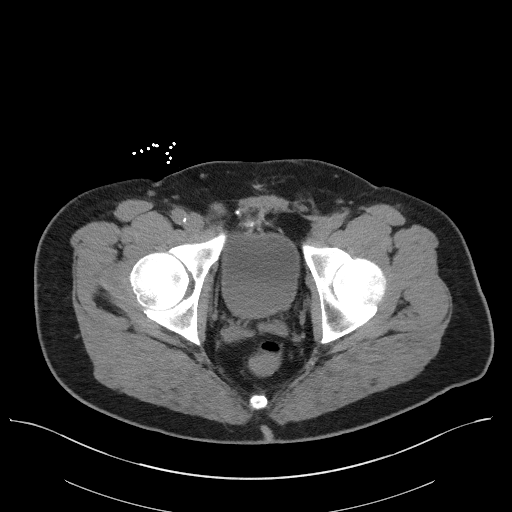
[im 31/109  soft-tissue]
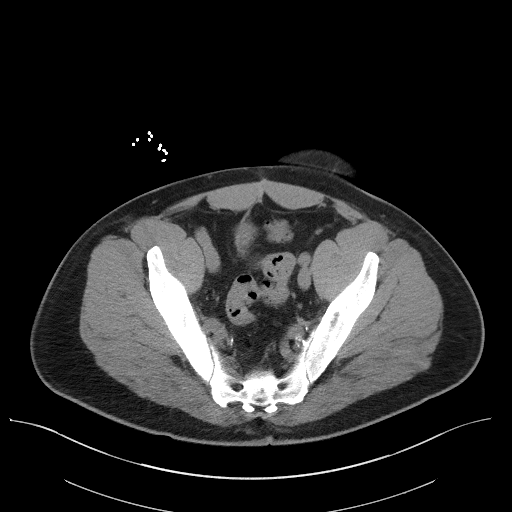
[im 39/109  soft-tissue]
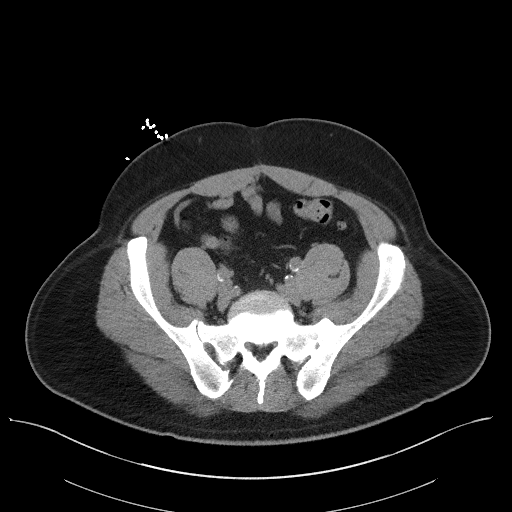
[im 48/109  soft-tissue]
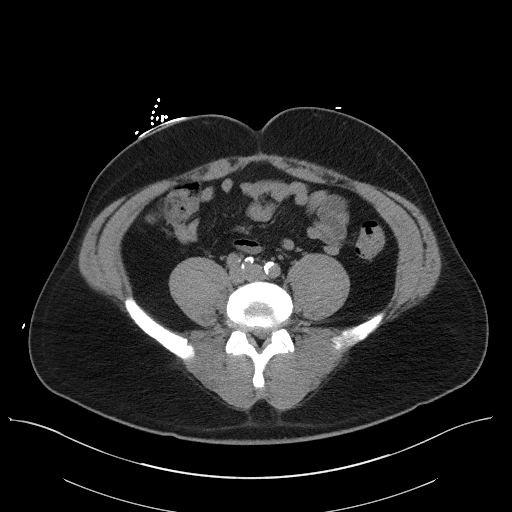
[im 57/109  soft-tissue]
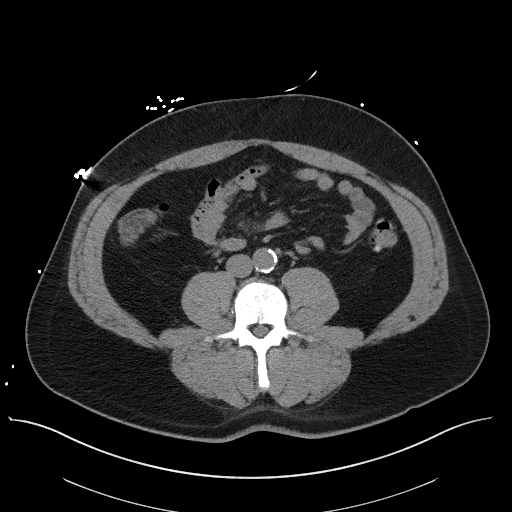
[im 61/109  soft-tissue]
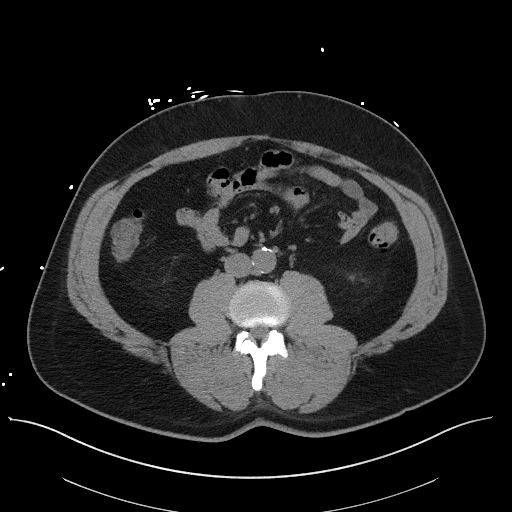
[im 70/109  soft-tissue]
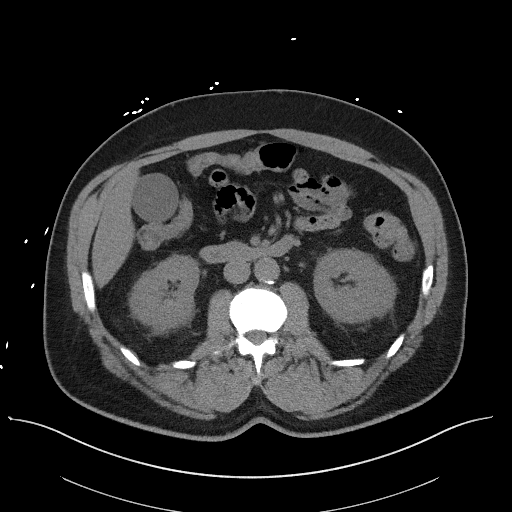
[im 70/109  bone]
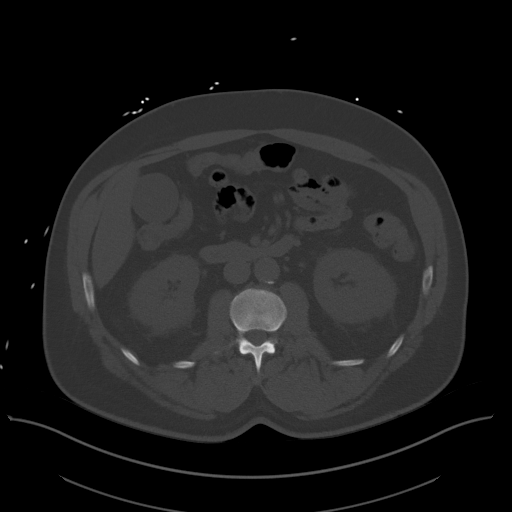
[im 78/109  soft-tissue]
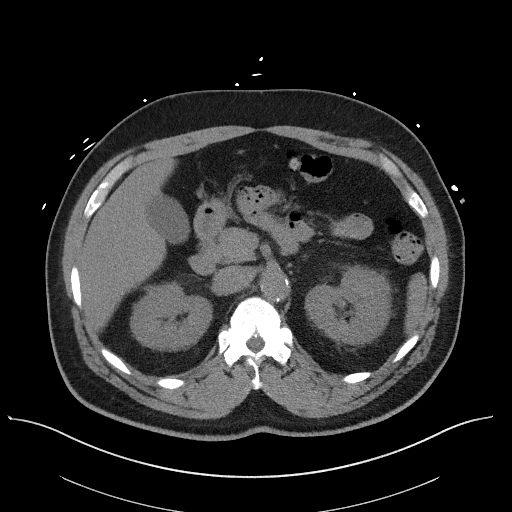
[im 87/109  soft-tissue]
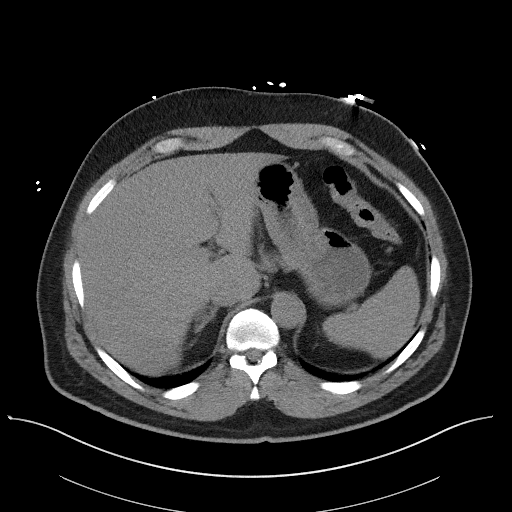
[im 96/109  soft-tissue]
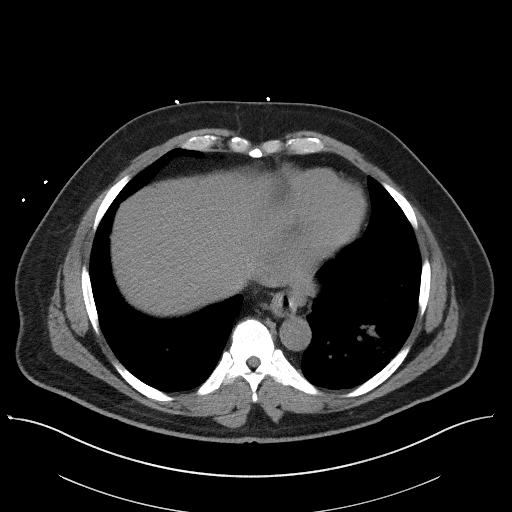
[im 104/109  soft-tissue]
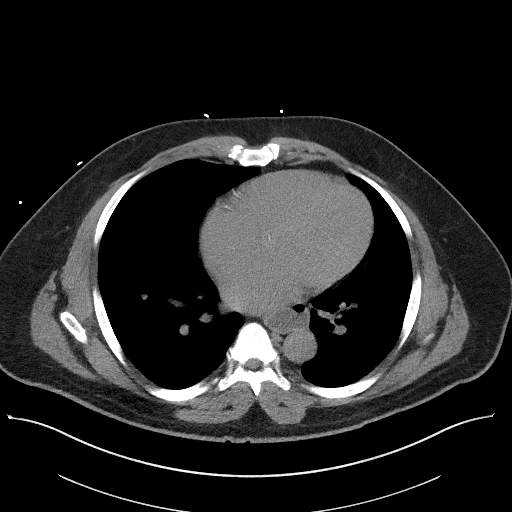

[Series 5: coronal st · coronal · 0.91mm/px · 3 of 102 slices shown]
[im 34/102  soft-tissue]
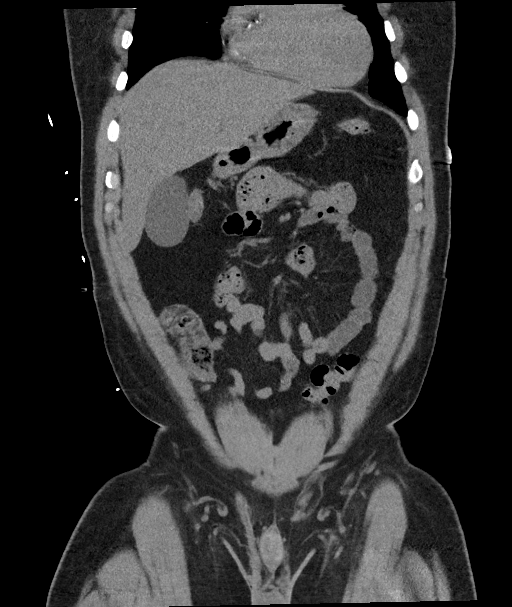
[im 45/102  soft-tissue]
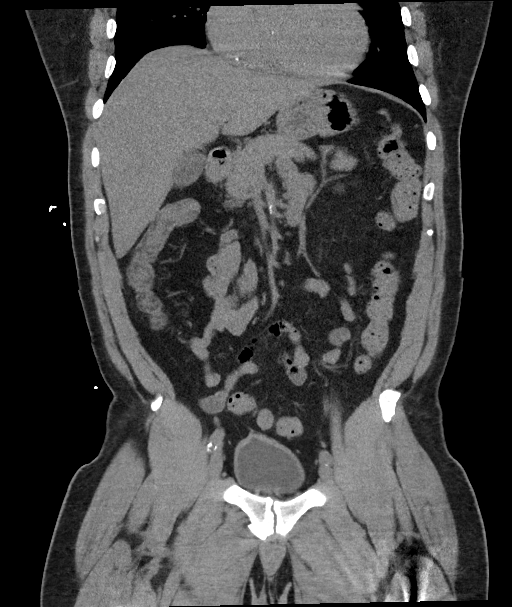
[im 57/102  soft-tissue]
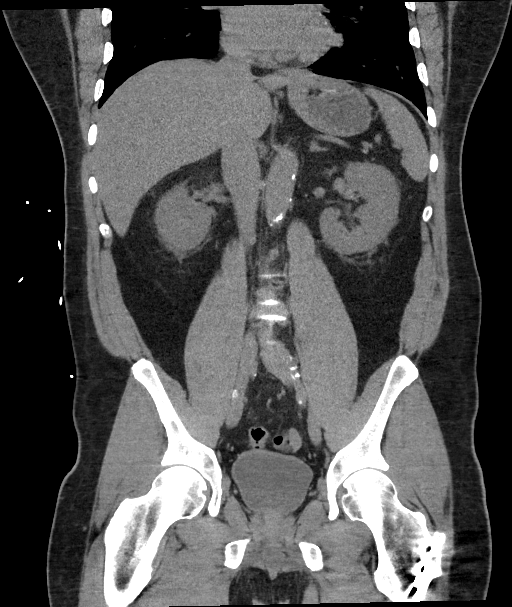

[16 of 46 positions shown; findings below may reference images not displayed]

FINDINGS: Lower chest: Focal airspace infiltration in the left lung base,
likely pneumonia. Small esophageal hiatal hernia behind the heart.

Hepatobiliary: No focal liver abnormality is seen. No gallstones,
gallbladder wall thickening, or biliary dilatation.

Pancreas: Unremarkable. No pancreatic ductal dilatation or
surrounding inflammatory changes.

Spleen: Normal in size without focal abnormality.

Adrenals/Urinary Tract: Adrenal glands are unremarkable. Kidneys are
normal, without renal calculi, focal lesion, or hydronephrosis.
Bladder is unremarkable.

Stomach/Bowel: Stomach, small bowel, and colon are not abnormally
distended. No wall thickening or inflammatory changes are
appreciated. Appendix is normal.

Vascular/Lymphatic: Aortic atherosclerosis. No enlarged abdominal or
pelvic lymph nodes.

Reproductive: Prostate is unremarkable.

Other: No abdominal wall hernia or abnormality. No abdominopelvic
ascites.

Musculoskeletal: No acute or significant osseous findings.
Postoperative changes in the left proximal femur.
IMPRESSION: 1. Focal airspace infiltration in the left lung base, likely
pneumonia.
2. Small esophageal hiatal hernia.
3. No acute process demonstrated in the abdomen or pelvis.
4. Aortic atherosclerosis.

## 2022-06-28 ENCOUNTER — Encounter: Payer: Self-pay | Admitting: Family Medicine

## 2022-06-28 ENCOUNTER — Ambulatory Visit: Payer: Medicaid Other | Admitting: Family Medicine

## 2022-06-28 DIAGNOSIS — Z113 Encounter for screening for infections with a predominantly sexual mode of transmission: Secondary | ICD-10-CM

## 2022-06-28 LAB — HM HIV SCREENING LAB: HM HIV Screening: NEGATIVE

## 2022-06-28 NOTE — Progress Notes (Signed)
[facility] Department STI clinic/screening visit  Subjective:  Ethan Arellano is a 62 y.o. male being seen today for an STI screening visit. The patient reports they do have symptoms.    Patient has the following medical conditions:   Patient Active Problem List   Diagnosis Date Noted   Syphilis 09/07/2021     Chief Complaint  Patient presents with   SEXUALLY TRANSMITTED DISEASE    Rash on penis and a sore starting about last week.  Here about 8 months came in as a contact to syphilis and was treated.     HPI  Patient reports to clinic for STI testing. Reports having a rash on his penis and a sore that he noticed. He states that this sore first looked like a "black head" and that "I mashed on it". Has a hx of syphilis.   Last HIV test per patient/review of record was No results found for: "HMHIVSCREEN" No results found for: "HIV"  Does the patient or their partner desires a pregnancy in the next year? No  Screening for MPX risk: Does the patient have an unexplained rash? No Is the patient MSM? No Does the patient endorse multiple sex partners or anonymous sex partners? No Did the patient have close or sexual contact with a person diagnosed with MPX? No Has the patient traveled outside the [place] where MPX is endemic? No Is there a high clinical suspicion for MPX-- evidenced by one of the following No  -Unlikely to be chickenpox  -Lymphadenopathy  -Rash that present in same phase of evolution on any given body part   See flowsheet for further details and programmatic requirements.   Immunization History  Administered Date(s) Administered   [employer] Vaccination 08/06/2019, 09/03/2019     The following portions of the patient's history were reviewed and updated as appropriate: allergies, current medications, past medical history, past social history, past surgical history and problem list.  Objective:  There were no vitals filed for this  visit.  Physical Exam Constitutional:      Appearance: Normal appearance.  HENT:     Head: Normocephalic and atraumatic.     Comments: No nits or hair loss    Mouth/Throat:     Mouth: Mucous membranes are moist. No oral lesions.     Pharynx: Oropharynx is clear. No oropharyngeal exudate or posterior oropharyngeal erythema.  Eyes:     General:        Right eye: No discharge.        Left eye: No discharge.     Conjunctiva/sclera:     Right eye: Right conjunctiva is not injected. No exudate.    Left eye: Left conjunctiva is not injected. No exudate. Pulmonary:     Effort: Pulmonary effort is normal.  Abdominal:     General: Abdomen is flat.     Palpations: Abdomen is soft. There is no hepatomegaly or mass.     Tenderness: There is no abdominal tenderness. There is no rebound.     Hernia: There is no hernia in the left inguinal area or right inguinal area.  Genitourinary:    Pubic Area: No rash or pubic lice (no nits).      Penis: No tenderness, discharge, swelling or lesions.      Testes: Normal.     Epididymis:     Right: Normal. No mass or tenderness.     Left: Normal. No mass or tenderness.     Rectum: Normal. No tenderness (no lesions  or discharge).       Comments: Penile Discharge Amount: none Color:  none  Red flat lesion on left head of penis Lymphadenopathy:     Head:     Right side of head: No preauricular or posterior auricular adenopathy.     Left side of head: No preauricular or posterior auricular adenopathy.     Cervical: No cervical adenopathy.     Upper Body:     Right upper body: No supraclavicular, axillary or epitrochlear adenopathy.     Left upper body: No supraclavicular, axillary or epitrochlear adenopathy.     Lower Body: No right inguinal adenopathy. No left inguinal adenopathy.  Skin:    General: Skin is warm and dry.     Findings: No lesion or rash.  Neurological:     Mental Status: He is alert and oriented to person, place, and time.        Assessment and Plan:  Ethan Arellano is a 62 y.o. male presenting to the [facility] Department for STI screening  1. Screening for venereal disease  - HIV [place] - Syphilis Serology, [facility] [facility] - Chlamydia/GC NAA, Confirmation   Patient does have STI symptoms Patient accepted all screenings including  urine GC/Chlamydia, and blood work for HIV/Syphilis. Patient meets criteria for HepB screening? Yes. Ordered? declined Patient meets criteria for HepC screening? Yes. Ordered? declined Recommended condom use with all sex Discussed importance of condom use for STI prevent  Treat positive test results per standing order. Discussed time line for [facility] results and that patient will be called with positive results and encouraged patient to call if he had not heard in 2 weeks Recommended repeat testing in 3 months with positive results. Recommended returning for continued or worsening symptoms.   Return if symptoms worsen or fail to improve, for STI screening.  Future Appointments  Date Time Provider Department Center  06/28/2022  1:20 PM Lenice Llamas, FNP [facility] None    Lenice Llamas, [place]

## 2022-06-28 NOTE — Progress Notes (Signed)
Pt appointment for STI screening with symptoms. Pt seen by FNP Sydnee Levans.

## 2022-07-01 LAB — CHLAMYDIA/GC NAA, CONFIRMATION
Chlamydia trachomatis, NAA: NEGATIVE
Neisseria gonorrhoeae, NAA: NEGATIVE

## 2022-07-07 ENCOUNTER — Ambulatory Visit: Payer: Medicaid Other

## 2022-07-07 ENCOUNTER — Telehealth: Payer: Self-pay

## 2022-07-07 DIAGNOSIS — A539 Syphilis, unspecified: Secondary | ICD-10-CM

## 2022-07-07 MED ORDER — PENICILLIN G BENZATHINE 1200000 UNIT/2ML IM SUSY
2.4000 10*6.[IU] | PREFILLED_SYRINGE | INTRAMUSCULAR | Status: AC
Start: 2022-07-07 — End: 2022-07-07
  Administered 2022-07-07: 2.4 10*6.[IU] via INTRAMUSCULAR

## 2022-07-07 NOTE — Telephone Encounter (Signed)
Syphilis treatment appointment made for today

## 2022-07-07 NOTE — Progress Notes (Signed)
Pt is here for treatment of Syphilis.  Bicillin 2.4 MU given IM without complications.  Pt monitored for 15 minutes. Pt has notified his "girlfriend" and she will be seen tomorrow.

## 2023-05-10 ENCOUNTER — Encounter: Payer: Self-pay | Admitting: Nurse Practitioner

## 2023-05-10 ENCOUNTER — Ambulatory Visit: Admitting: Nurse Practitioner

## 2023-05-10 DIAGNOSIS — Z113 Encounter for screening for infections with a predominantly sexual mode of transmission: Secondary | ICD-10-CM

## 2023-05-10 NOTE — Progress Notes (Signed)
 [facility] Department STI clinic [address] [place] [address] Main phone: [phone]  STI screening visit  Subjective:  Ethan Arellano is a 63 y.o. male being seen today for an STI screening visit. The patient reports they do have symptoms.    Patient has the following medical conditions:  Patient Active Problem List   Diagnosis Date Noted   Syphilis 09/07/2021    Chief Complaint  Patient presents with   SEXUALLY TRANSMITTED DISEASE   Patient is a pleasant 63 y.o. male who presents to the office today requesting symptomatic STI testing. The patient explains that he did yard work over the weekend (3 days ago) and that evening after taking a shower noticed his undergarments were rubbing on his genital area causing irritation. He stated the area of concern has since been itching and he has now noticed a small bump to the underside of his penile shaft. The patient reports no urinary symptoms and declines discharge from the urethra.  He reports 1 male partner in the last 2 months, and practices penile/vaginal penetrative sex. Patient reports using condoms always. Patient indicates a history of syphilis twice, most recently June 2024 (less than 1 year ago). He reports last sex was about 8 days ago.   STI screening history: Last HIV test per patient/review of record was  Lab Results  Component Value Date   HMHIVSCREEN Negative - Validated 06/28/2022    Last HEPC test per patient/review of record was No results found for: "HMHEPCSCREEN" No components found for: "HEPC"   Last HEPB test per patient/review of record was No components found for: "HMHEPBSCREEN"   Fertility: Does the patient or their partner desires a pregnancy in the next year? No  Screening for MPX risk: Does the patient have an unexplained rash? No Is the patient MSM? No Does the patient endorse multiple sex partners or anonymous sex partners? No Did the patient have close  or sexual contact with a person diagnosed with MPX? No Has the patient traveled outside the [place]  where MPX is endemic? No Is there a high clinical suspicion for MPX-- evidenced by one of the following No  -Unlikely to be chickenpox  -Lymphadenopathy  -Rash that present in same phase of evolution on any given body part   See flowsheet for further details and programmatic requirements.   Immunization History  Administered Date(s) Administered   Moderna Sars-Covid-2 Vaccination 08/06/2019, 09/03/2019     The following portions of the patient's history were reviewed and updated as appropriate: allergies, current medications, past medical history, past social history, past surgical history and problem list.  Objective:  There were no vitals filed for this visit.  Physical Exam Nursing note reviewed. Chaperone present: Declined chaperone..  Constitutional:      Appearance: Normal appearance.  HENT:     Head: Normocephalic.     Salivary Glands: Right salivary gland is not diffusely enlarged or tender. Left salivary gland is not diffusely enlarged or tender.     Mouth/Throat:     Lips: Pink. No lesions.     Mouth: Mucous membranes are moist. No oral lesions.     Tongue: No lesions. Tongue does not deviate from midline.     Pharynx: Oropharynx is clear. Uvula midline. No oropharyngeal exudate or posterior oropharyngeal erythema.     Tonsils: No tonsillar exudate.  Eyes:     General:        Right eye: No discharge.  Left eye: No discharge.     Conjunctiva/sclera:     Right eye: Right conjunctiva is not injected. No exudate.    Left eye: Left conjunctiva is not injected. No exudate. Pulmonary:     Effort: Pulmonary effort is normal.  Genitourinary:    Pubic Area: No rash or pubic lice.      Penis: Lesions present. No tenderness, discharge or swelling.      Testes: Normal.     Epididymis:     Right: Normal. No mass or tenderness.     Left: Normal. No mass or tenderness.      Tanner stage (genital): 5.     Comments: One tiny (too small to measure) raised lesion to underside of penile shaft. Lesion has an even smaller opening to the side, possibly from patient scratching. Area is color of surrounding skin. No drainage, not painful to touch per patient.  Lymphadenopathy:     Head:     Right side of head: No submental, submandibular, tonsillar, preauricular or posterior auricular adenopathy.     Left side of head: No submental, submandibular, tonsillar, preauricular or posterior auricular adenopathy.     Cervical: No cervical adenopathy.     Right cervical: No superficial or posterior cervical adenopathy.    Left cervical: No superficial or posterior cervical adenopathy.     Upper Body:     Right upper body: No supraclavicular or axillary adenopathy.     Left upper body: No supraclavicular or axillary adenopathy.     Lower Body: No right inguinal adenopathy. No left inguinal adenopathy.  Skin:    General: Skin is warm and dry.     Findings: Lesion present. No rash.     Comments: Skin tone appropriate for ethnicity.  See GU for lesion description.   Neurological:     Mental Status: He is alert and oriented to person, place, and time.  Psychiatric:        Attention and Perception: Attention and perception normal.        Mood and Affect: Mood and affect normal.        Speech: Speech normal.        Behavior: Behavior normal. Behavior is cooperative.        Thought Content: Thought content normal.    Assessment and Plan:  Ethan Arellano is a 63 y.o. male presenting to the [facility] Department for STI screening  1. Screening for venereal disease (Primary) Advised patient we would test for syphilis, but the area of concern on the underside of the penile shaft is not consistent with the appearance of a syphilitic chancre. Advised this could be some time of irritation from contact with something during yard work or an insect bite, but less likely as the  area does not appear to be a poison oak/ivy/sumac rash. Advised patient if test results are negative and the bump remains he should follow up with his PCP and may need a derm referral. Educated patient that a cool cloth to the area may help ease itching sensation. Advised to clean the area with mild soap and tepid water and to avoid scratching or touching the area to prevent possible infection from bacteria of the hands and nails. Patient verbalized understanding and agreed with plan.    - Syphilis Serology, [facility] - Chlamydia/GC NAA, Confirmation  Patient does have STI symptoms Patient accepted screenings including  urine GC/Chlamydia, and blood work for Syphilis. Declined HIV, HCV, HBV. Patient meets criteria for  HepB screening? Yes. Ordered? No-Declined Patient meets criteria for HepC screening? Yes. Ordered? No-Declined Recommended condom use with all sex Discussed importance of condom use for STI prevention  Treat positive test results per standing order. Discussed time line for [facility] results and that patient will be called with positive results and encouraged patient to call if he had not heard in 2 weeks Recommended repeat testing in 3 months with positive results. Recommended returning for continued or worsening symptoms.   Return if symptoms worsen or fail to improve.  No future appointments.  Total time with patient 20 minutes.   Merleen Stare, NP

## 2023-05-10 NOTE — Progress Notes (Signed)
 Pt is here for STD screening. Condoms declined. Pt given the the opportunity to ask questions for any clarifications. Questions answered. Austine Lefort, RN.

## 2023-05-12 LAB — CHLAMYDIA/GC NAA, CONFIRMATION
Chlamydia trachomatis, NAA: NEGATIVE
Neisseria gonorrhoeae, NAA: NEGATIVE
